# Patient Record
Sex: Male | Born: 1935 | Race: White | Hispanic: No | Marital: Married | Smoking: Former smoker
Health system: Southern US, Community
[De-identification: ages and names within clinical notes are randomized; demographics above are authoritative.]

## PROBLEM LIST (undated history)

## (undated) DIAGNOSIS — F1011 Alcohol abuse, in remission: Secondary | ICD-10-CM

## (undated) DIAGNOSIS — Z860101 Personal history of adenomatous and serrated colon polyps: Secondary | ICD-10-CM

## (undated) DIAGNOSIS — N320 Bladder-neck obstruction: Secondary | ICD-10-CM

## (undated) DIAGNOSIS — Z86010 Personal history of colonic polyps: Secondary | ICD-10-CM

## (undated) DIAGNOSIS — J302 Other seasonal allergic rhinitis: Secondary | ICD-10-CM

## (undated) DIAGNOSIS — K852 Alcohol induced acute pancreatitis without necrosis or infection: Secondary | ICD-10-CM

## (undated) DIAGNOSIS — N400 Enlarged prostate without lower urinary tract symptoms: Secondary | ICD-10-CM

## (undated) DIAGNOSIS — I10 Essential (primary) hypertension: Secondary | ICD-10-CM

## (undated) DIAGNOSIS — E785 Hyperlipidemia, unspecified: Secondary | ICD-10-CM

## (undated) DIAGNOSIS — M1990 Unspecified osteoarthritis, unspecified site: Secondary | ICD-10-CM

## (undated) DIAGNOSIS — J42 Unspecified chronic bronchitis: Secondary | ICD-10-CM

## (undated) DIAGNOSIS — Z8719 Personal history of other diseases of the digestive system: Secondary | ICD-10-CM

## (undated) DIAGNOSIS — Z8709 Personal history of other diseases of the respiratory system: Secondary | ICD-10-CM

## (undated) HISTORY — DX: Alcohol induced acute pancreatitis without necrosis or infection: K85.20

## (undated) HISTORY — DX: Unspecified chronic bronchitis: J42

## (undated) HISTORY — DX: Essential (primary) hypertension: I10

## (undated) HISTORY — DX: Personal history of adenomatous and serrated colon polyps: Z86.0101

## (undated) HISTORY — DX: Hyperlipidemia, unspecified: E78.5

## (undated) HISTORY — DX: Personal history of colon polyps: Z86.010

---

## 2004-03-18 ENCOUNTER — Ambulatory Visit (HOSPITAL_COMMUNITY): Admission: RE | Admit: 2004-03-18 | Discharge: 2004-03-18 | Payer: Self-pay | Admitting: Family Medicine

## 2006-01-29 ENCOUNTER — Ambulatory Visit (HOSPITAL_COMMUNITY): Admission: RE | Admit: 2006-01-29 | Discharge: 2006-01-29 | Payer: Self-pay | Admitting: Family Medicine

## 2006-05-11 ENCOUNTER — Ambulatory Visit (HOSPITAL_COMMUNITY): Admission: RE | Admit: 2006-05-11 | Discharge: 2006-05-11 | Payer: Self-pay | Admitting: Family Medicine

## 2007-07-01 ENCOUNTER — Ambulatory Visit (HOSPITAL_COMMUNITY): Admission: RE | Admit: 2007-07-01 | Discharge: 2007-07-01 | Payer: Self-pay | Admitting: Family Medicine

## 2007-07-23 ENCOUNTER — Ambulatory Visit: Payer: Self-pay | Admitting: Internal Medicine

## 2007-07-26 HISTORY — PX: COLONOSCOPY: SHX174

## 2007-08-13 ENCOUNTER — Encounter: Payer: Self-pay | Admitting: Internal Medicine

## 2007-08-13 ENCOUNTER — Ambulatory Visit: Payer: Self-pay | Admitting: Internal Medicine

## 2007-08-13 ENCOUNTER — Ambulatory Visit (HOSPITAL_COMMUNITY): Admission: RE | Admit: 2007-08-13 | Discharge: 2007-08-13 | Payer: Self-pay | Admitting: Internal Medicine

## 2007-09-17 ENCOUNTER — Ambulatory Visit: Payer: Self-pay | Admitting: Internal Medicine

## 2008-02-06 ENCOUNTER — Emergency Department (HOSPITAL_COMMUNITY): Admission: EM | Admit: 2008-02-06 | Discharge: 2008-02-06 | Payer: Self-pay | Admitting: Emergency Medicine

## 2010-03-16 ENCOUNTER — Encounter: Payer: Self-pay | Admitting: Family Medicine

## 2010-07-09 NOTE — Consult Note (Signed)
NAME:  Noah Jennings, Noah Jennings              ACCOUNT NO.:  [id number]   MEDICAL RECORD NO.:  [id number]          PATIENT TYPE:  AMB   LOCATION:  DAY                           FACILITY:  [facility]   PHYSICIAN:  Noah Jennings, M.D. DATE OF BIRTH:  1936/02/22   DATE OF CONSULTATION:  07/23/2007  DATE OF DISCHARGE:                                 CONSULTATION   CHIEF COMPLAINT:  Fecal incontinence.   REQUESTING PHYSICIAN:  Noah Novas, MD.   HISTORY OF PRESENT ILLNESS:  Patient is a 75 year old Caucasian  gentleman who presents with a three-month history of intermittent fecal  discharge.  He states this has been occurring more constantly over the  past one month.  He will have a sensation of feeling moisture wet and  then would note that his undergarments would be damp.  He describes  having a discharge which he states must be clear.  There is no staining  on his undergarments.  He is able to control his stool, however.  He has  one to two formed bowel movements daily.  He has occasional fresh blood  noted on the toilet tissue.  He has constant low back pain which he has  had for years.  He has had intermittent pelvic pain and a fleeting pain  in his penis without any discharge.  He states his weight has been  stable.  He denies dysuria or hematuria.  He denies any nausea or  vomiting.  He has intermittent heartburn which is self-limiting.  He  denies any dysphagia or odynophagia.  His last colonoscopy was in the  80s.  This was done for blood in the stool.  He does not recall having  any polyps.  He has had a barium enema recently and back in 2006.  Recent study showed scattered descending and sigmoid colon diverticula.  The cecum was in the right mid abdomen.   CURRENT MEDICATIONS:  1. Lovastatin 20 mg daily.  2. Diovan daily.  3. Aspirin 81 mg daily.  4. Xopenex inhaler daily.   ALLERGIES:  No known drug allergies.   PAST MEDICAL HISTORY:  1. Hypertension.  2.  Hypercholesterolemia.  3. Chronic low back pain.  4. Chronic bronchitis.  5. Hiatal hernia.  6. Remote alcoholic pancreatitis per history.  7. Remote colonoscopy as above.  8. Bilateral cataract surgery.   FAMILY HISTORY:  Mother is 54 with history of intestinal blockage.  Father deceased at age 41 with emphysema, CAD, diabetes.  No family  history of colorectal cancer or chronic GI illnesses.   SOCIAL HISTORY:  He is married.  He has two children.  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  He quit smoking in 1998.  He smoked for  over 45 years, one pack a day.  He quit alcohol consumption in 1998.  States he previously had been a daily user and on the weekends and  raised himself as a moderate user.   REVIEW OF SYSTEMS:  See HPI for GI.  CONSTITUTIONAL:  Denies weight  loss.  CARDIOPULMONARY:  No chest pain or shortness of  breath.  See HPI  for GU.   PHYSICAL EXAMINATION:  VITAL SIGNS:  Weight 178, height 5 feet 6 inches,  temperature 97.8, blood pressure 160/90, pulse 64.  GENERAL APPEARANCE:  A pleasant, well-nourished, well-developed  Caucasian male in no acute distress.  SKIN:  Warm and dry, no jaundice.  HEENT:  Sclerae are nonicteric.  Oropharyngeal mucosa moist and pink.  No lesions, erythema or exudate.  NECK:  No lymphadenopathy or thyromegaly.  CHEST:  Lungs clear to auscultation.  CARDIOVASCULAR:  Regular rate and rhythm, normal S1 and S2.  No murmurs,  rubs, or gallops.  ABDOMEN:  Positive bowel sounds.  Abdomen soft, nontender, nondistended,  no organomegaly or masses.  No rebound, no guarding, no abdominal  bruits.  He has a small umbilical hernia easily reducible, nontender.  RECTAL:  No external lesions.  Good sphincter tone.  No masses in the  rectal vault.  No stool present.  LOWER EXTREMITIES:  No edema.   IMPRESSION:  [name] is a 75 year old gentleman with a three-month  history of anorectal clear discharge.  His description does not  appear  to be consistent with fecal incontinence.  He is able to control his  stools.  He does have intermittent bright red blood per rectum.  He has  had a barium enema which is not a good modality for looking at the  rectal area.  We will go ahead and pursue colonoscopy at this time as  recommended by Dr. Jena Gauss.   PLAN:  1. Colonoscopy in the near future.  2. Further recommendations to follow.   I discussed risks, benefits, and alternatives with regards to, but not  limited to, the risk of reaction to medication, bleeding, infection,  perforation with the patient and he is agreeable to proceed.   I would like to thank Noah Novas, MD, for allowing [place] to  take part in the care of this patient.      Noah Jennings, P.AJonathon Jennings, M.D.  Electronically Signed    LL/MEDQ  D:  07/23/2007  T:  07/23/2007  Job:  [id number]

## 2010-07-09 NOTE — Op Note (Signed)
NAME:  Noah Jennings, Noah Jennings              ACCOUNT NO.:  [id number]   MEDICAL RECORD NO.:  [id number]          PATIENT TYPE:  AMB   LOCATION:  DAY                           FACILITY:  [facility]   PHYSICIAN:  R. Roetta Sessions, M.D. DATE OF BIRTH:  1935/06/01   DATE OF PROCEDURE:  08/13/2007  DATE OF DISCHARGE:                               OPERATIVE REPORT   INDICATIONS FOR PROCEDURE:  A 71-year gentleman with 31-month history of  clear anorectal discharge, occasional small red blood per rectum.  Barium enema recently demonstrated some subtle left-sided diverticula,  no obvious abnormalities otherwise, because of his hematochezia and the  limited sensitivity of barium contrast studies he is undergoing  colonoscopy.  This approach has been discussed with [name] at  length.  Risks, benefits, alternatives, and limitations have been  reviewed.  Questions answered and he is agreeable.  Please see the  documentation in the medical record.   PROCEDURE NOTE:  O2 saturation, blood pressure, pulse, respirations  monitored throughout the entire procedure.  Conscious sedation, Versed 4  mg IV, Demerol 100 mg IV in divided doses.   INSTRUMENT:  Pentax video chip system.   FINDINGS:  Digital rectal exam revealed no abnormalities.  Endoscopic  findings:  Prep was adequate.  Colon:  Colonic mucosa was surveyed from  the rectosigmoid junction to the left transverse, right colon,  appendiceal orifice, ileocecal valve, and cecum.  These structures were  well-seen and photographed for the record.  Terminal ileum was intubated  in centimeters from this level.  Scope was slowly cautiously withdrawn.  All previously mentioned mucosal surfaces were again seen.  The patient  had a flat 5-mm adenomatous-appearing polyp in the mid ascending colon,  it was cold snared and recovered through the scope.  The remainder of  colonic mucosa appeared normal.  I did not identify any diverticula,  although shallow diverticula  may not have been appreciated on the  examination.  Scope was pulled down into the rectum with examination of  the rectal mucosa including retroflex view of the anal verge, on fos  view of the anal canal demonstrated a diminutive rectal polyp 15 cm  which was cold biopsied, anal papilla and friable anal canal hemorrhoids  only.  The patient tolerated the procedure well, was reactive in  endoscopy.   IMPRESSION:  1. Friable anal canal hemorrhoids.  2. Anal papilla.  3. Diminutive rectal polyp 15 cm status post cold biopsy removal.      Remainder of the rectal mucosa appeared normal.  4. Flat adenomatous-appearing polyp in mid ascending colon status post      cold snare polypectomy.  Remainder of colonic mucosa and terminal      ileal mucosa appeared normal.  I suspect the patient has bled from      this anorectum.   RECOMMENDATIONS:  1. Ten day course of Anusol-HC Suppositories 1 per rectum at bedtime      daily, Metamucil or Citrucel fiber      supplement.  Hopefully, this will take care of his seepage      problems.  2. Follow  up on path.  3. I plan to see [name] back in followup in 1 month.      Jonathon Bellows, M.D.  Electronically Signed     RMR/MEDQ  D:  08/13/2007  T:  08/13/2007  Job:  [id number]   cc:   Melvyn Novas, MD  Fax: [phone]

## 2010-07-09 NOTE — Assessment & Plan Note (Signed)
NAME:  Noah Jennings               CHART#:  [id number]   DATE:  09/17/2007                       DOB:  08/12/1935   CHIEF COMPLAINT:  Followup colonoscopy and rectal discharge.   PROBLEM LIST:  1. Rectal discharge.  2. Anal canal hemorrhoids.  3. History of adenomatous colon polyp found on last colonoscopy, Dr.      Jena Jennings on 08/13/2007.  He had a serrated adenoma removed from his      ascending colon.  He also had a hyperplastic polyp of 15 cm.  4. Hypertension.  5. Hypercholesterolemia.  6. Chronic low back pain.  7. Chronic bronchitis.  8. Remote history of alcoholic pancreatitis.  9. Bilateral cataracts.  10.Hiatal hernia.   SUBJECTIVE:  The patient is a 75 year old Caucasian male.  He recently  underwent colonoscopy as described above.  He was found to have  hemorrhoids.  He was started on Metamucil, he seems to be doing well  with this.  He continues to complain of clear rectal discharge, which  wets his undergarments for the last 3 months.  There is no purulent  discharge.  No fever.  No proctalgia.  Denies any diarrhea or  constipation.  He is generally having 2-3 semiformed bowel movements per  day.  He completed the course of Anusol suppositories.  Weight is  stable.  His appetite is good.  Denies any fever or chills.   CURRENT MEDICATIONS:  See the list from 09/17/2007.   ALLERGIES:  No known drug allergies.   OBJECTIVE:  VITAL SIGNS:  Weight 178 pounds, height 66 inches, temp  98.5, blood pressure 130/78, and pulse 72.  GENERAL:  He is a well-developed, well-nourished elderly Caucasian male  in no acute distress.  HEENT:  Sclerae clear and nonicteric.  Conjunctivae pink.  Oropharynx  pink and moist without lesions.  CHEST:  Heart has regular rhythm.  Normal S1 and S2.  ABDOMEN:  Positive bowel sounds x4.  No bruits auscultated.  Soft,  nontender, and nondistended without palpable mass or hepatosplenomegaly.  No rebound, tenderness or guarding.  EXTREMITIES:   Without clubbing or edema.   ASSESSMENT:  The patient is a 75 year old Caucasian male with history of  adenomatous polyp, hemorrhoids, and clear rectal discharge.  I explained  to him that most discharge is benign, and we will continue to monitor  this and see how he does.   PLAN:  1. Colonoscopy in June of 2014.  2. Begin Alinia or similar probiotics on daily basis.  3. He is to call if he has any further problems with rectal discharge.       Noah Jennings, N.P.  Electronically Signed     R. Roetta Sessions, M.D.  Electronically Signed    KJ/MEDQ  D:  09/17/2007  T:  09/17/2007  Job:  [id number]   cc:   Noah Novas, MD

## 2010-11-29 LAB — DIFFERENTIAL
Basophils Relative: 0 % (ref 0–1)
Monocytes Absolute: 1.1 10*3/uL — ABNORMAL HIGH (ref 0.1–1.0)
Monocytes Relative: 7 % (ref 3–12)
Neutro Abs: 14 10*3/uL — ABNORMAL HIGH (ref 1.7–7.7)

## 2010-11-29 LAB — BASIC METABOLIC PANEL
CO2: 26 mEq/L (ref 19–32)
Calcium: 8.3 mg/dL — ABNORMAL LOW (ref 8.4–10.5)
Chloride: 103 mEq/L (ref 96–112)
GFR calc Af Amer: >60 mL/min (ref >60)
Potassium: 3.3 mEq/L — ABNORMAL LOW (ref 3.5–5.1)
Sodium: 134 mEq/L — ABNORMAL LOW (ref 135–145)

## 2010-11-29 LAB — CBC
Hemoglobin: 13.1 g/dL (ref 13.0–17.0)
MCHC: 33.7 g/dL (ref 30.0–36.0)
RBC: 4.38 MIL/uL (ref 4.22–5.81)

## 2010-11-29 LAB — CK TOTAL AND CKMB (NOT AT ARMC)
CK, MB: 1.4 ng/mL (ref 0.3–4.0)
Total CK: 50 U/L (ref 7–232)

## 2010-11-29 LAB — TROPONIN I: Troponin I: 0.02 ng/mL (ref 0.00–0.06)

## 2012-08-10 ENCOUNTER — Encounter: Payer: Self-pay | Admitting: Internal Medicine

## 2012-12-20 ENCOUNTER — Telehealth: Payer: Self-pay | Admitting: *Deleted

## 2012-12-20 NOTE — Telephone Encounter (Signed)
Opened in Error.

## 2012-12-22 ENCOUNTER — Encounter: Payer: Self-pay | Admitting: Gastroenterology

## 2012-12-22 ENCOUNTER — Encounter (INDEPENDENT_AMBULATORY_CARE_PROVIDER_SITE_OTHER): Payer: Self-pay

## 2012-12-22 ENCOUNTER — Ambulatory Visit (INDEPENDENT_AMBULATORY_CARE_PROVIDER_SITE_OTHER): Payer: Medicare Other | Admitting: Gastroenterology

## 2012-12-22 VITALS — BP 119/63 | HR 73 | Temp 97.6°F | Ht 66.0 in | Wt 171.8 lb

## 2012-12-22 DIAGNOSIS — Z86010 Personal history of colonic polyps: Secondary | ICD-10-CM | POA: Insufficient documentation

## 2012-12-22 MED ORDER — PEG 3350-KCL-NA BICARB-NACL 420 G PO SOLR: 4000.0000 mL | ORAL | Status: DC

## 2012-12-22 NOTE — Assessment & Plan Note (Signed)
Surveillance colonoscopy given history of adenomatous colon polyp.  I have discussed the risks, alternatives, benefits with regards to but not limited to the risk of reaction to medication, bleeding, infection, perforation and the patient is agreeable to proceed. Written consent to be obtained.

## 2012-12-22 NOTE — Patient Instructions (Signed)
1. Colonoscopy with Dr. Rourk as scheduled. Please see separate instructions.  

## 2012-12-22 NOTE — Progress Notes (Signed)
Primary Care Physician:  Isabella Stalling, MD  Primary Gastroenterologist:  Roetta Sessions, MD   Chief Complaint  Patient presents with  . Follow-up    HPI:  Noah Jennings is a 77 y.o. male here to schedule five-year surveillance colonoscopy. He has a history of adenomatous colon polyp on his last colonoscopy. Doing very well. Take Metamucil every day. Bowel movement regular. No melena rectal bleeding. Occasional heartburn related to certain foods. No dysphagia, vomiting, unintentional weight loss, abdominal pain.  Current Outpatient Prescriptions  Medication Sig Dispense Refill  . albuterol (PROVENTIL HFA;VENTOLIN HFA) 108 (90 BASE) MCG/ACT inhaler Inhale 2 puffs into the lungs every 4 (four) hours as needed for wheezing.      . rosuvastatin (CRESTOR) 10 MG tablet Take 10 mg by mouth daily.      . tamsulosin (FLOMAX) 0.4 MG CAPS capsule Take 0.4 mg by mouth daily.      . valsartan-hydrochlorothiazide (DIOVAN-HCT) 320-25 MG per tablet Take 1 tablet by mouth daily.       No current facility-administered medications for this visit.    Allergies as of 12/22/2012  . (No Known Allergies)    Past Medical History  Diagnosis Date  . Hx of adenomatous colonic polyps     07/2007  . HTN (hypertension)   . Hyperlipidemia   . Chronic bronchitis   . Alcoholic pancreatitis     remote    Past Surgical History  Procedure Laterality Date  . Colonoscopy  07/2007    ZOX:[id number] anal canal hemorrhoids/Anal papilla/Diminutive rectal polyp 15 cm status post cold biopsy removal/Flat adenomatous-appearing polyp in mid ascending colon status post  cold snare polypectomy.  Remainder of colonic mucosa and terminal ileal mucosa appeared normal. Serrated adenoma.    Family History  Problem Relation Age of Onset  . Colon cancer Neg Hx     History   Social History  . Marital Status: Married    Spouse Name: N/A    Number of Children: 2  . Years of Education: N/A   Occupational History  .  Not on file.   Social History Main Topics  . Smoking status: Former Smoker    Start date: 03/24/1996  . Smokeless tobacco: Not on file     Comment: smoked 45 years  . Alcohol Use: No     Comment: quit 1998  . Drug Use: No  . Sexual Activity: Not on file   Other Topics Concern  . Not on file   Social History Narrative  . No narrative on file      ROS:  General: Negative for anorexia, weight loss, fever, chills, fatigue, weakness. Eyes: Negative for vision changes.  ENT: Negative for hoarseness, difficulty swallowing , nasal congestion. CV: Negative for chest pain, angina, palpitations, dyspnea on exertion, peripheral edema.  Respiratory: Negative for dyspnea at rest, dyspnea on exertion, cough, sputum, wheezing.  GI: See history of present illness. GU:  Negative for dysuria, hematuria, urinary incontinence, urinary frequency, nocturnal urination.  MS: Negative for joint pain, low back pain.  Derm: Negative for rash or itching.  Neuro: Negative for weakness, abnormal sensation, seizure, frequent headaches, memory loss, confusion.  Psych: Negative for anxiety, depression, suicidal ideation, hallucinations.  Endo: Negative for unusual weight change.  Heme: Negative for bruising or bleeding. Allergy: Negative for rash or hives.    Physical Examination:  BP 119/63  Pulse 73  Temp(Src) 97.6 F (36.4 C) (Oral)  Ht 5\' 6"  (1.676 m)  Wt 171 lb 12.8 oz (77.928  kg)  BMI 27.74 kg/m2   General: Well-nourished, well-developed in no acute distress.  Head: Normocephalic, atraumatic.   Eyes: Conjunctiva pink, no icterus. Mouth: Oropharyngeal mucosa moist and pink , no lesions erythema or exudate. Neck: Supple without thyromegaly, masses, or lymphadenopathy.  Lungs: Clear to auscultation bilaterally.  Heart: Regular rate and rhythm, no murmurs rubs or gallops.  Abdomen: Bowel sounds are normal, nontender, nondistended, no hepatosplenomegaly or masses, no abdominal bruits or     hernia , no rebound or guarding.   Rectal: Deferred Extremities: No lower extremity edema. No clubbing or deformities.  Neuro: Alert and oriented x 4 , grossly normal neurologically.  Skin: Warm and dry, no rash or jaundice.   Psych: Alert and cooperative, normal mood and affect.

## 2012-12-22 NOTE — Progress Notes (Signed)
cc'd to pcp 

## 2013-01-11 ENCOUNTER — Encounter (HOSPITAL_COMMUNITY): Payer: Self-pay | Admitting: *Deleted

## 2013-01-11 ENCOUNTER — Ambulatory Visit (HOSPITAL_COMMUNITY)
Admission: RE | Admit: 2013-01-11 | Discharge: 2013-01-11 | Disposition: A | Discharge status: Home Health | Payer: Medicare Other | Source: Ambulatory Visit | Attending: Internal Medicine | Admitting: Internal Medicine

## 2013-01-11 ENCOUNTER — Encounter (HOSPITAL_COMMUNITY)
Admission: RE | Disposition: A | Discharge status: Home Health | Payer: Self-pay | Source: Ambulatory Visit | Attending: Internal Medicine

## 2013-01-11 DIAGNOSIS — Z860100 Personal history of colon polyps, unspecified: Secondary | ICD-10-CM | POA: Insufficient documentation

## 2013-01-11 DIAGNOSIS — Z1211 Encounter for screening for malignant neoplasm of colon: Secondary | ICD-10-CM

## 2013-01-11 DIAGNOSIS — D126 Benign neoplasm of colon, unspecified: Secondary | ICD-10-CM | POA: Insufficient documentation

## 2013-01-11 DIAGNOSIS — I10 Essential (primary) hypertension: Secondary | ICD-10-CM | POA: Insufficient documentation

## 2013-01-11 HISTORY — PX: COLONOSCOPY: SHX5424

## 2013-01-11 SURGERY — COLONOSCOPY
Anesthesia: Moderate Sedation

## 2013-01-11 MED ORDER — MIDAZOLAM HCL 5 MG/5ML IJ SOLN
INTRAMUSCULAR | Status: AC
  Filled 2013-01-11: qty 10

## 2013-01-11 MED ORDER — ONDANSETRON HCL 4 MG/2ML IJ SOLN
INTRAMUSCULAR | Status: AC
  Filled 2013-01-11: qty 2

## 2013-01-11 MED ORDER — MEPERIDINE HCL 100 MG/ML IJ SOLN
INTRAMUSCULAR | Status: DC | PRN
  Administered 2013-01-11: 50 mg via INTRAVENOUS
  Administered 2013-01-11: 25 mg via INTRAVENOUS

## 2013-01-11 MED ORDER — ONDANSETRON HCL 4 MG/2ML IJ SOLN
INTRAMUSCULAR | Status: DC | PRN
  Administered 2013-01-11: 4 mg via INTRAVENOUS

## 2013-01-11 MED ORDER — MEPERIDINE HCL 100 MG/ML IJ SOLN
INTRAMUSCULAR | Status: AC
  Filled 2013-01-11: qty 2

## 2013-01-11 MED ORDER — MIDAZOLAM HCL 5 MG/5ML IJ SOLN
INTRAMUSCULAR | Status: DC | PRN
  Administered 2013-01-11: 2 mg via INTRAVENOUS
  Administered 2013-01-11 (×2): 1 mg via INTRAVENOUS

## 2013-01-11 MED ORDER — STERILE WATER FOR IRRIGATION IR SOLN
Status: DC | PRN
  Administered 2013-01-11: 11:00:00

## 2013-01-11 MED ORDER — SODIUM CHLORIDE 0.9 % IV SOLN
INTRAVENOUS | Status: DC
  Administered 2013-01-11: 1000 mL via INTRAVENOUS

## 2013-01-11 NOTE — Interval H&P Note (Signed)
History and Physical Interval Note:  01/11/2013 11:11 AM  Noah Jennings  has presented today for surgery, with the diagnosis of COLON POLYPS  The various methods of treatment have been discussed with the patient and family. After consideration of risks, benefits and other options for treatment, the patient has consented to  Procedure(s) with comments: COLONOSCOPY (N/A) - 11:15 as a surgical intervention .  The patient's history has been reviewed, patient examined, no change in status, stable for surgery.  I have reviewed the patient's chart and labs.  Questions were answered to the patient's satisfaction.   No change. Surveillance colonoscopy per plan.The risks, benefits, limitations, alternatives and imponderables have been reviewed with the patient. Questions have been answered. All parties are agreeable.   Eula Listen

## 2013-01-11 NOTE — Op Note (Signed)
[facility] [address] [place], [address]   COLONOSCOPY PROCEDURE REPORT  PATIENT: Noah Jennings, Noah Jennings  MR#:         [id number] BIRTHDATE: [birth year] , 76  yrs. old GENDER: Male ENDOSCOPIST: R.  Roetta Sessions, MD FACP FACG REFERRED BY:  Oval Linsey, M.D. PROCEDURE DATE:  01/11/2013 PROCEDURE:     Colonoscopy with snare polypectomy  INDICATIONS: History of colonic adenoma  INFORMED CONSENT:  The risks, benefits, alternatives and imponderables including but not limited to bleeding, perforation as well as the possibility of a missed lesion have been reviewed.  The potential for biopsy, lesion removal, etc. have also been discussed.  Questions have been answered.  All parties agreeable. Please see the history and physical in the medical record for more information.  MEDICATIONS: Versed 4 mg IV and Demerol 75 mg IV in divided doses. Zofran 4 mg IV.  DESCRIPTION OF PROCEDURE:  After a digital rectal exam was performed, the EC-[id number] ([id number])  colonoscope was advanced from the anus through the rectum and colon to the area of the cecum, ileocecal valve and appendiceal orifice.  The cecum was deeply intubated.  These structures were well-seen and photographed for the record.  From the level of the cecum and ileocecal valve, the scope was slowly and cautiously withdrawn.  The mucosal surfaces were carefully surveyed utilizing scope tip deflection to facilitate fold flattening as needed.  The scope was pulled down into the rectum where a thorough examination including retroflexion was performed.    FINDINGS:  Adequate preparation. Anal patella; otherwise, normal appearing rectum. (1) 4 mm polyp in the base of the cecum; otherwise, the remainder of the colonic mucosa appeared normal.  THERAPEUTIC / DIAGNOSTIC MANEUVERS PERFORMED:  The above-mentioned polyp was cold snared/removed and recovered for the pathologist.  COMPLICATIONS: None  CECAL WITHDRAWAL  TIME:  8 minutes  IMPRESSION:  Colonic polyp-removed as described above  RECOMMENDATIONS: Further recommendations to follow pending review of pathology report   _______________________________ eSigned:  R. Roetta Sessions, MD FACP [facility] 01/11/2013 11:39 AM   CC:

## 2013-01-11 NOTE — H&P (View-Only) (Signed)
Primary Care Physician:  DONDIEGO,RICHARD M, MD  Primary Gastroenterologist:  Michael Rourk, MD   Chief Complaint  Patient presents with  . Follow-up    HPI:  Noah Jennings is a 77 y.o. male here to schedule five-year surveillance colonoscopy. He has a history of adenomatous colon polyp on his last colonoscopy. Doing very well. Take Metamucil every day. Bowel movement regular. No melena rectal bleeding. Occasional heartburn related to certain foods. No dysphagia, vomiting, unintentional weight loss, abdominal pain.  Current Outpatient Prescriptions  Medication Sig Dispense Refill  . albuterol (PROVENTIL HFA;VENTOLIN HFA) 108 (90 BASE) MCG/ACT inhaler Inhale 2 puffs into the lungs every 4 (four) hours as needed for wheezing.      . rosuvastatin (CRESTOR) 10 MG tablet Take 10 mg by mouth daily.      . tamsulosin (FLOMAX) 0.4 MG CAPS capsule Take 0.4 mg by mouth daily.      . valsartan-hydrochlorothiazide (DIOVAN-HCT) 320-25 MG per tablet Take 1 tablet by mouth daily.       No current facility-administered medications for this visit.    Allergies as of 12/22/2012  . (No Known Allergies)    Past Medical History  Diagnosis Date  . Hx of adenomatous colonic polyps     07/2007  . HTN (hypertension)   . Hyperlipidemia   . Chronic bronchitis   . Alcoholic pancreatitis     remote    Past Surgical History  Procedure Laterality Date  . Colonoscopy  07/2007    RMR:Friable anal canal hemorrhoids/Anal papilla/Diminutive rectal polyp 15 cm status post cold biopsy removal/Flat adenomatous-appearing polyp in mid ascending colon status post  cold snare polypectomy.  Remainder of colonic mucosa and terminal ileal mucosa appeared normal. Serrated adenoma.    Family History  Problem Relation Age of Onset  . Colon cancer Neg Hx     History   Social History  . Marital Status: Married    Spouse Name: N/A    Number of Children: 2  . Years of Education: N/A   Occupational History  .  Not on file.   Social History Main Topics  . Smoking status: Former Smoker    Start date: 03/24/1996  . Smokeless tobacco: Not on file     Comment: smoked 45 years  . Alcohol Use: No     Comment: quit 1998  . Drug Use: No  . Sexual Activity: Not on file   Other Topics Concern  . Not on file   Social History Narrative  . No narrative on file      ROS:  General: Negative for anorexia, weight loss, fever, chills, fatigue, weakness. Eyes: Negative for vision changes.  ENT: Negative for hoarseness, difficulty swallowing , nasal congestion. CV: Negative for chest pain, angina, palpitations, dyspnea on exertion, peripheral edema.  Respiratory: Negative for dyspnea at rest, dyspnea on exertion, cough, sputum, wheezing.  GI: See history of present illness. GU:  Negative for dysuria, hematuria, urinary incontinence, urinary frequency, nocturnal urination.  MS: Negative for joint pain, low back pain.  Derm: Negative for rash or itching.  Neuro: Negative for weakness, abnormal sensation, seizure, frequent headaches, memory loss, confusion.  Psych: Negative for anxiety, depression, suicidal ideation, hallucinations.  Endo: Negative for unusual weight change.  Heme: Negative for bruising or bleeding. Allergy: Negative for rash or hives.    Physical Examination:  BP 119/63  Pulse 73  Temp(Src) 97.6 F (36.4 C) (Oral)  Ht 5' 6" (1.676 m)  Wt 171 lb 12.8 oz (77.928   kg)  BMI 27.74 kg/m2   General: Well-nourished, well-developed in no acute distress.  Head: Normocephalic, atraumatic.   Eyes: Conjunctiva pink, no icterus. Mouth: Oropharyngeal mucosa moist and pink , no lesions erythema or exudate. Neck: Supple without thyromegaly, masses, or lymphadenopathy.  Lungs: Clear to auscultation bilaterally.  Heart: Regular rate and rhythm, no murmurs rubs or gallops.  Abdomen: Bowel sounds are normal, nontender, nondistended, no hepatosplenomegaly or masses, no abdominal bruits or     hernia , no rebound or guarding.   Rectal: Deferred Extremities: No lower extremity edema. No clubbing or deformities.  Neuro: Alert and oriented x 4 , grossly normal neurologically.  Skin: Warm and dry, no rash or jaundice.   Psych: Alert and cooperative, normal mood and affect.   

## 2013-01-12 ENCOUNTER — Encounter: Payer: Self-pay | Admitting: Internal Medicine

## 2013-01-18 ENCOUNTER — Encounter (HOSPITAL_COMMUNITY): Payer: Self-pay | Admitting: Internal Medicine

## 2016-02-26 ENCOUNTER — Other Ambulatory Visit (HOSPITAL_COMMUNITY): Payer: Self-pay | Admitting: Family Medicine

## 2016-02-26 DIAGNOSIS — R1310 Dysphagia, unspecified: Secondary | ICD-10-CM

## 2016-02-26 DIAGNOSIS — R52 Pain, unspecified: Secondary | ICD-10-CM

## 2016-02-29 ENCOUNTER — Ambulatory Visit (HOSPITAL_COMMUNITY)
Admission: RE | Admit: 2016-02-29 | Discharge: 2016-02-29 | Disposition: A | Discharge status: Home / Self Care | Payer: Medicare Other | Source: Ambulatory Visit | Attending: Family Medicine | Admitting: Family Medicine

## 2016-02-29 DIAGNOSIS — R1310 Dysphagia, unspecified: Secondary | ICD-10-CM

## 2016-02-29 DIAGNOSIS — R52 Pain, unspecified: Secondary | ICD-10-CM

## 2016-02-29 DIAGNOSIS — K224 Dyskinesia of esophagus: Secondary | ICD-10-CM | POA: Diagnosis not present

## 2017-07-24 IMAGING — RF DG ESOPHAGUS
14 of 15 series · 14 of 24 positions shown · non-contrast
Comparison: None

CLINICAL DATA: Dysphagia, hurts to eat solids and liquids, feels
like a night sticking in his chest, history hypertension, chronic
bronchitis, alcoholic pancreatitis, former smoker

EXAM:
ESOPHOGRAM / BARIUM SWALLOW / BARIUM TABLET STUDY
TECHNIQUE: Combined double contrast and single contrast examination performed
using effervescent crystals, thick barium liquid, and thin barium
liquid. The patient was observed with fluoroscopy swallowing a 13 mm
barium sulphate tablet.
FLUOROSCOPY TIME:  Fluoroscopy Time:  1 minutes 36 seconds
Radiation Exposure Index (if provided by the fluoroscopic device):
19.1 mGy
Number of Acquired Spot Images: Multiple screen captures during
fluoroscopy

[Series 1: cp_standard · 0.19mm/px · 1 of 34 frames shown (1 of 14)]
[frame 5/34]
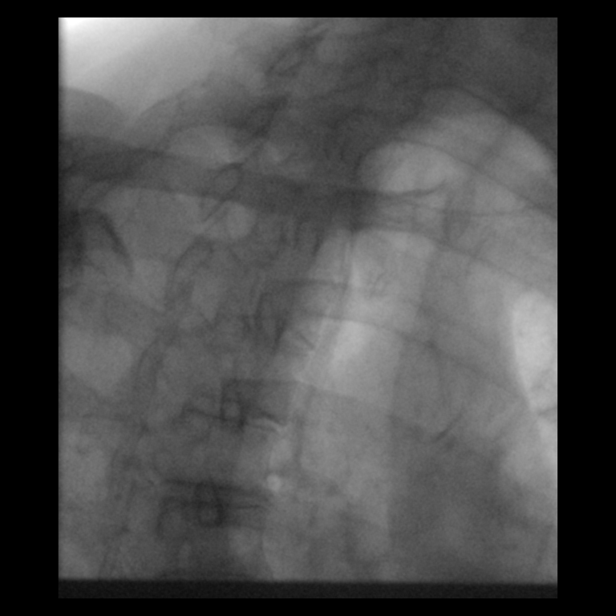

[Series 2: cp_standard · 0.19mm/px · 1 of 40 frames shown (2 of 14)]
[frame 1/40]
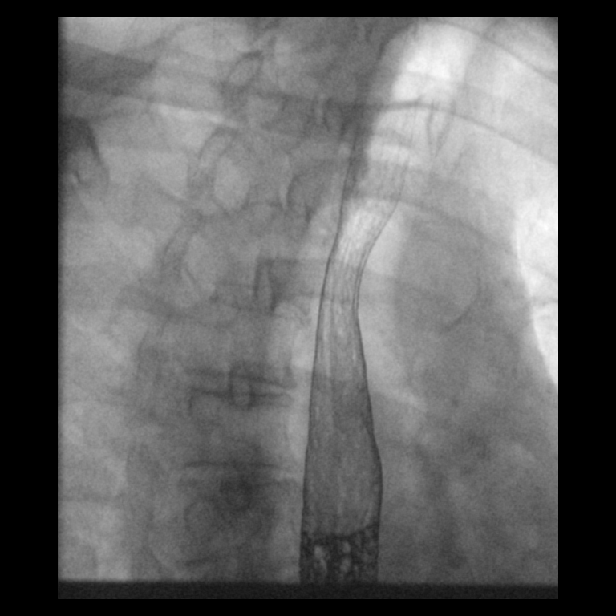

[Series 3: cp_standard · 0.19mm/px · 1 of 27 frames shown (3 of 14)]
[frame 14/27]
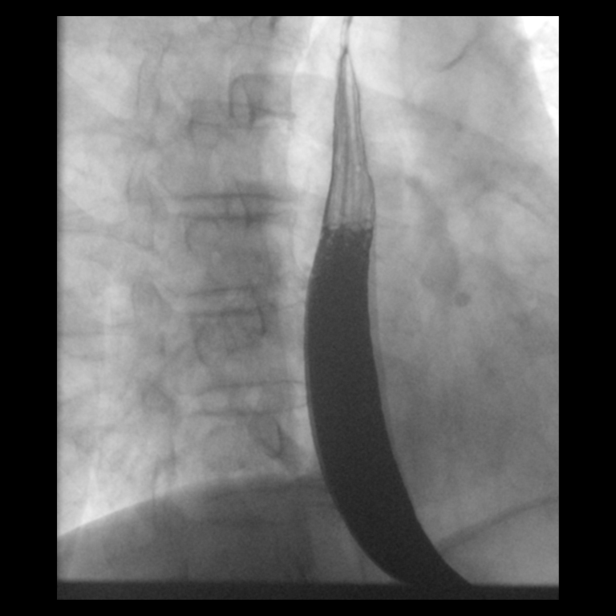

[Series 4: cp_standard · 0.20mm/px · 1 of 33 frames shown (4 of 14)]
[frame 5/33]
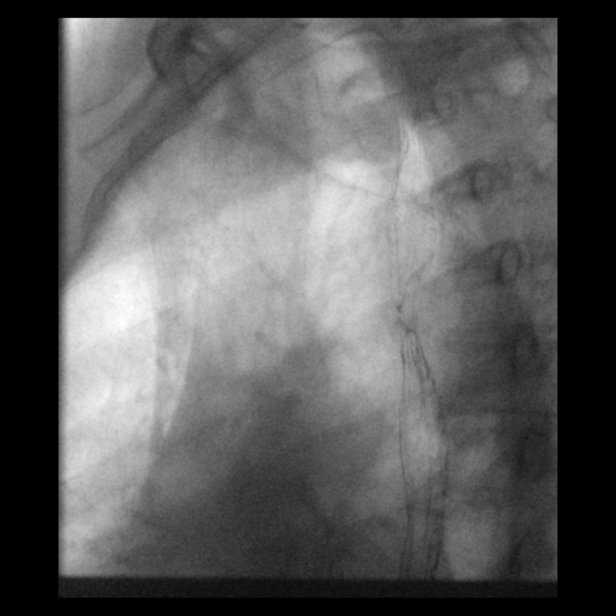

[Series 5: cp_standard · 0.20mm/px · 1 of 37 frames shown (5 of 14)]
[frame 6/37]
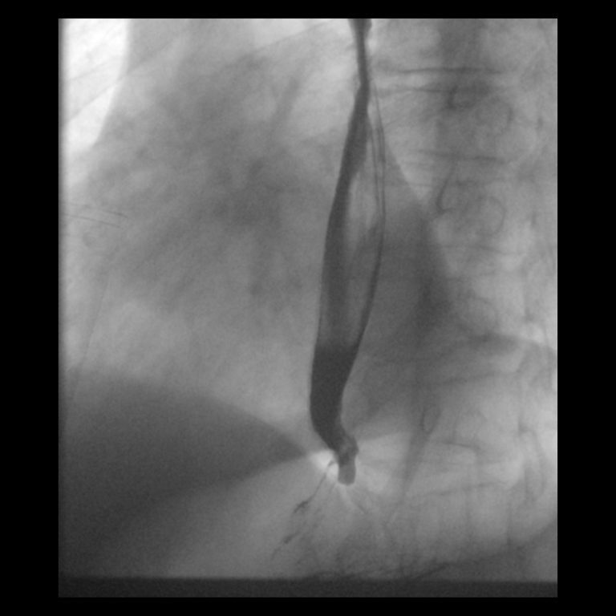

[Series 6: cp_standard · 0.20mm/px · 1 of 31 frames shown (6 of 14)]
[frame 5/31]
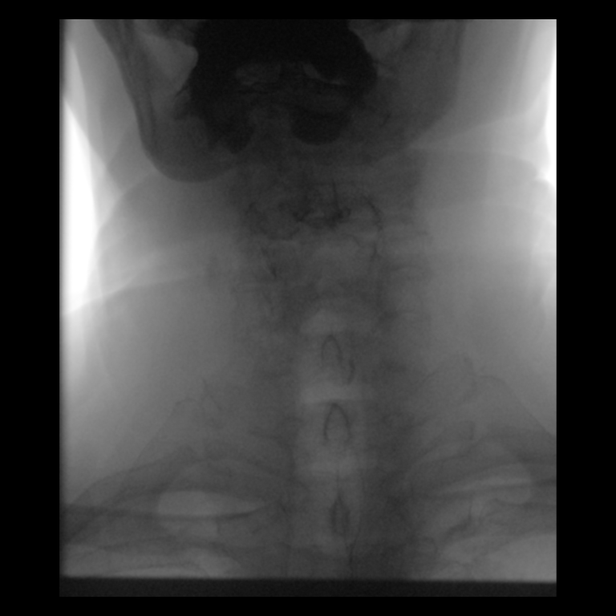

[Series 7: cp_standard · 0.18mm/px · 1 of 1 slices shown (7 of 14)]
[im 1/1]
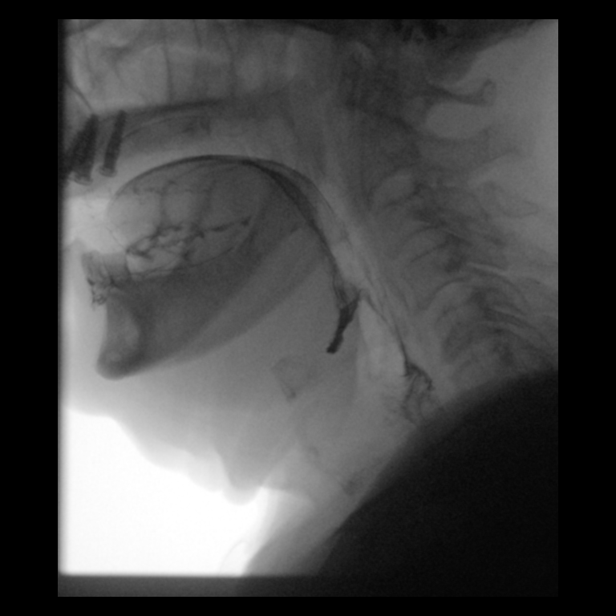

[Series 8: cp_standard · 0.18mm/px · 1 of 23 frames shown (8 of 14)]
[frame 20/23]
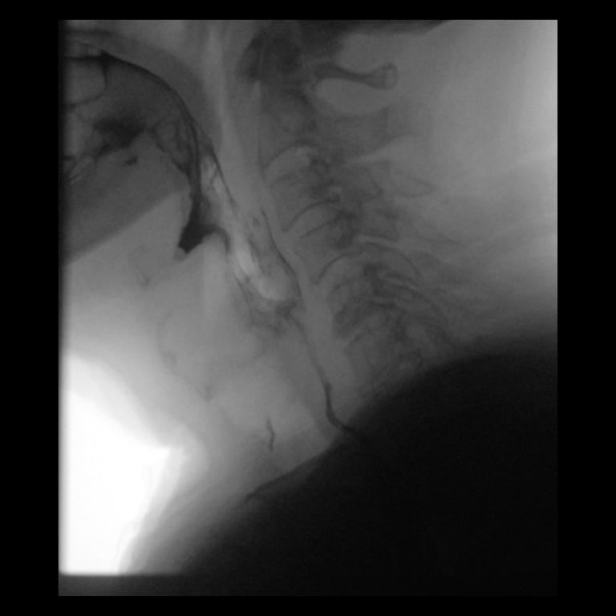

[Series 10: cp_standard · 0.20mm/px · 1 of 4 frames shown (9 of 14)]
[frame 3/4]
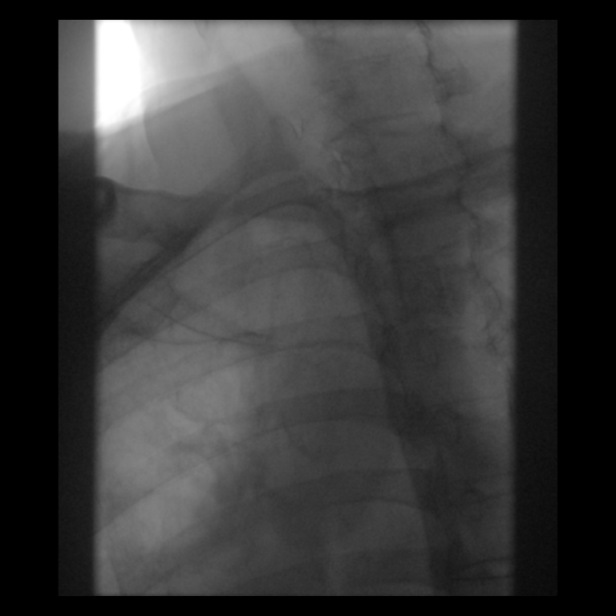

[Series 11: cp_standard · 0.20mm/px · 1 of 19 frames shown (10 of 14)]
[frame 10/19]
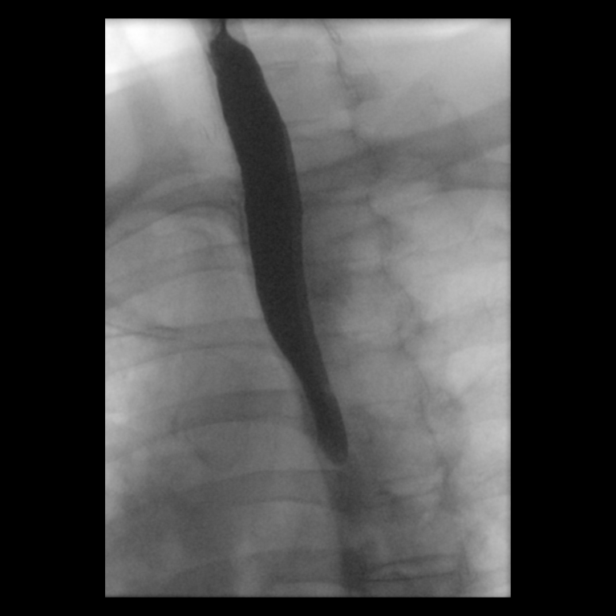

[Series 12: cp_standard · 0.20mm/px · 1 of 40 frames shown (11 of 14)]
[frame 35/40]
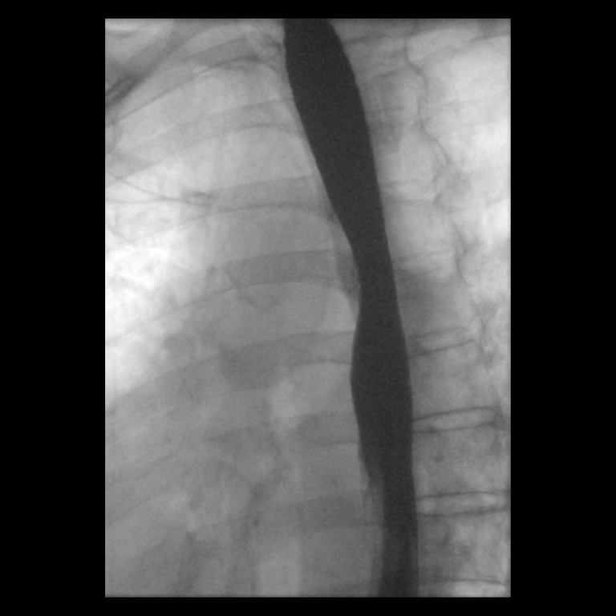

[Series 12: cp_standard · 0.20mm/px · 1 of 14 frames shown (12 of 14)]
[frame 4/14]
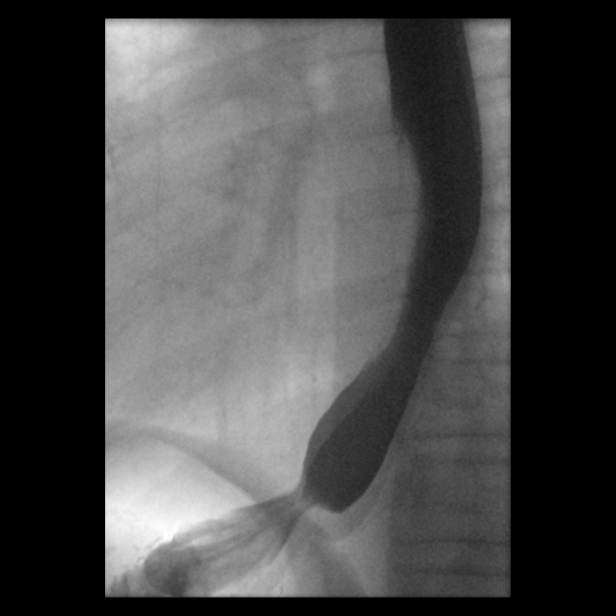

[Series 14: cp_standard · 0.20mm/px · 1 of 7 frames shown (13 of 14)]
[frame 6/7]
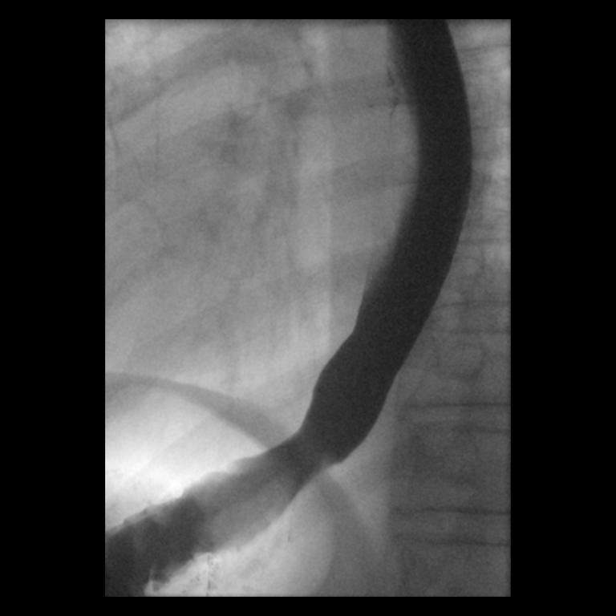

[Series 15: cp_standard · 0.20mm/px · 1 of 23 frames shown (14 of 14)]
[frame 23/23]
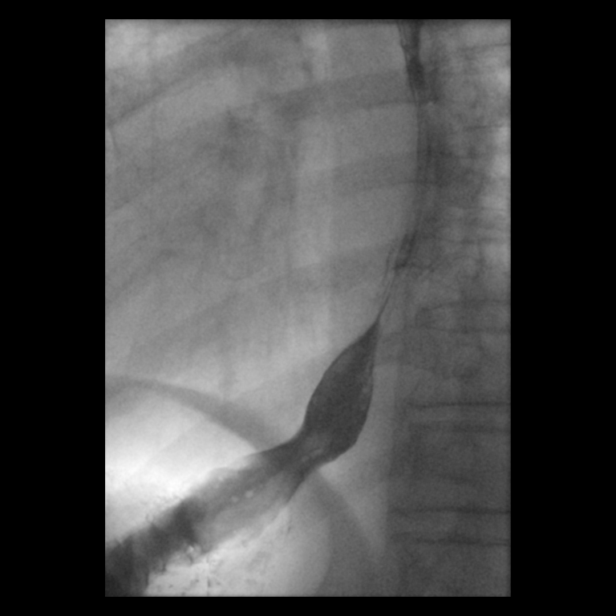

[14 of 24 positions shown; findings below may reference images not displayed]

FINDINGS: Normal esophageal distention.

No esophageal mass or stricture.

12.5 mm diameter barium tablet passed from oral cavity to stomach
without delay.

Smooth appearance of esophageal mucosa on air contrast imaging.

No esophageal ulceration or irregularity identified.

No hiatal hernia or gastroesophageal reflux seen during the exam.

Diffuse impairment of esophageal motility with incomplete clearance
of barium by primary peristaltic waves.

Prolonged retention of contrast within the thoracic esophagus with
patient in an RAO prone position, with scattered secondary and
tertiary waves noted.

Targeted rapid sequence imaging of the cervical esophagus and
hypopharynx at real-time showed no laryngeal penetration or
aspiration.

However, contrast was identified within the proximal trachea
consistent with aspiration, though this did not occur on the
real-time swallows ; patient cleared this contrast with voluntary
coughing, with no spontaneous cough reflex noted.
IMPRESSION: Aspiration of contrast into the trachea with absent spontaneous
cough reflex.

This was cleared with voluntary coughing.

Diffuse esophageal dysmotility.

## 2017-12-07 ENCOUNTER — Encounter: Payer: Self-pay | Admitting: Internal Medicine

## 2018-04-23 ENCOUNTER — Ambulatory Visit: Payer: Medicare HMO | Admitting: Urology

## 2018-04-23 DIAGNOSIS — R3912 Poor urinary stream: Secondary | ICD-10-CM | POA: Diagnosis not present

## 2018-04-23 DIAGNOSIS — N401 Enlarged prostate with lower urinary tract symptoms: Secondary | ICD-10-CM | POA: Diagnosis not present

## 2018-08-20 ENCOUNTER — Ambulatory Visit (INDEPENDENT_AMBULATORY_CARE_PROVIDER_SITE_OTHER): Payer: Medicare HMO | Admitting: Urology

## 2018-08-20 DIAGNOSIS — N401 Enlarged prostate with lower urinary tract symptoms: Secondary | ICD-10-CM

## 2018-08-20 DIAGNOSIS — R3912 Poor urinary stream: Secondary | ICD-10-CM

## 2018-08-23 ENCOUNTER — Other Ambulatory Visit: Payer: Self-pay | Admitting: Urology

## 2018-08-30 ENCOUNTER — Other Ambulatory Visit (HOSPITAL_COMMUNITY)
Admission: RE | Admit: 2018-08-30 | Discharge: 2018-08-30 | Disposition: A | Discharge status: Home / Self Care | Payer: Medicare HMO | Source: Ambulatory Visit | Attending: Urology | Admitting: Urology

## 2018-08-30 DIAGNOSIS — Z01812 Encounter for preprocedural laboratory examination: Secondary | ICD-10-CM | POA: Insufficient documentation

## 2018-08-30 DIAGNOSIS — Z1159 Encounter for screening for other viral diseases: Secondary | ICD-10-CM | POA: Diagnosis not present

## 2018-08-30 LAB — SARS CORONAVIRUS 2 (TAT 6-24 HRS): SARS Coronavirus 2: NEGATIVE

## 2018-08-31 ENCOUNTER — Other Ambulatory Visit: Payer: Self-pay

## 2018-08-31 ENCOUNTER — Encounter (HOSPITAL_BASED_OUTPATIENT_CLINIC_OR_DEPARTMENT_OTHER): Payer: Self-pay | Admitting: *Deleted

## 2018-08-31 NOTE — Progress Notes (Signed)
Spoke w/ pt via phone for pre-op interview.  Npo after mn.  Arrive at 0730.  Needs istat and ekg.  Pt had covid test yesterday.  Reviewed [facility] guidelines and visitor restriction policy.  Pt to bring home medication.

## 2018-08-31 NOTE — Progress Notes (Signed)

## 2018-09-01 NOTE — H&P (Signed)
CC: I have symptoms of an enlarged prostate.  HPI: Noah Jennings is a 83 year-old male established patient who is here for symptoms of enlarged prostate.    Noah Jennings returns today in f/u. He has BPH with BOO and remains on tamsulosin bid. He has persistent LUTS with an IPSS of 22. He has not had a recurrent UTI since his last visit. He is to have cystoscopy today.   GU Hx: Noah Jennings is an 83 yo WM who was sent in consultation by Dr. Cindie Laroche for BPH with BOO. He has been on tamsulosin for several years. 6 weeks ago he had a cough and right pleuritic pain. He got two rounds of abx for that but as soon as he finished he got symptoms of a UTI. He was given abx and had his tamsulosin double. He started voiding better and didn't have to take the abx. He had no hematuria but he had some dysuria. He had increased hesitancy and weak stream particularly at night. He has had no prior UTI's, stones or GU surgery. His UA today is unremarkable. His IPSS is 34.     CC: AUA Questions Scoring.  HPI:     AUA Symptom Score: Less than 50% of the time he has the sensation of not emptying his bladder completely when finished urinating. 50% of the time he has to urinate again fewer than two hours after he has finished urinating. 50% of the time he has to start and stop again several times when he urinates. More than 50% of the time he finds it difficult to postpone urination. More than 50% of the time he has a weak urinary stream. 50% of the time he has to push or strain to begin urination. He has to get up to urinate 2 times from the time he goes to bed until the time he gets up in the morning.   Calculated AUA Symptom Score: 21    ALLERGIES: None   MEDICATIONS: Aspirin 81 mg tablet,chewable 1 tablet PO Daily  Tamsulosin Hcl 0.4 mg capsule 1 capsule PO Daily  Losartan Potassium 100 mg tablet 1 tablet PO Daily  Rosuvastatin Calcium 10 mg tablet 1 tablet PO Daily  Vitamin D3 125 mcg (5,000 unit) tablet 1  tablet PO Daily     GU PSH: Complex Uroflow - 04/23/2018     NON-GU PSH: None   GU PMH: BPH w/LUTS, He is emptying ok but has a very slow stream with severe LUTS on tamsulosin 0.8mg  daily. I am going to have him return for cystoscopy to assess his options for further therapy. - 04/23/2018 Weak Urinary Stream - 04/23/2018    NON-GU PMH: Asthma Hypercholesterolemia Hypertension    FAMILY HISTORY: Emphysema - Father Heart problem - Mother   SOCIAL HISTORY: Marital Status: Married Preferred Language: English; Race: White Current Smoking Status: Patient does not smoke anymore. Has not smoked since 03/28/1991. Smoked for 30 years. Smoked 1 pack per day.   Tobacco Use Assessment Completed: Used Tobacco in last 30 days? Drinks 3 caffeinated drinks per day. Patient's occupation is/was retired.    REVIEW OF SYSTEMS:    GU Review Male:   Patient reports burning/ pain with urination, get up at night to urinate, and have to strain to urinate . Patient denies frequent urination, hard to postpone urination, leakage of urine, stream starts and stops, trouble starting your stream, erection problems, and penile pain.  Gastrointestinal (Upper):   Patient denies nausea, vomiting, and indigestion/ heartburn.  Gastrointestinal (Lower):   Patient denies diarrhea and constipation.  Constitutional:   Patient denies fever, night sweats, weight loss, and fatigue.  Skin:   Patient denies skin rash/ lesion and itching.  Eyes:   Patient denies blurred vision and double vision.  Ears/ Nose/ Throat:   Patient denies sore throat and sinus problems.  Hematologic/Lymphatic:   Patient denies easy bruising and swollen glands.  Cardiovascular:   Patient denies leg swelling and chest pains.  Respiratory:   Patient denies cough and shortness of breath.  Endocrine:   Patient denies excessive thirst.  Musculoskeletal:   Patient denies back pain and joint pain.  Neurological:   Patient denies headaches and dizziness.   Psychologic:   Patient denies depression and anxiety.   VITAL SIGNS: None   MULTI-SYSTEM PHYSICAL EXAMINATION:    Constitutional: Well-nourished. No physical deformities. Normally developed. Good grooming.  Respiratory: Normal breath sounds. No labored breathing, no use of accessory muscles.   Cardiovascular: Regular rate and rhythm. No murmur, no gallop.      PAST DATA REVIEWED:  Source Of History:  Patient   PROCEDURES:         Flexible Cystoscopy - 52000  Risks, benefits, and some of the potential complications of the procedure were discussed. Cipro 500mg  given for antibiotic prophylaxis. 24ml of 2% lidocaine jelly was instilled intraurethrally.      Meatus:  Normal size. Normal location. Normal condition.  Urethra:  No strictures.  External Sphincter:  Normal.  Verumontanum:  Normal.  Prostate:  Obstructing. small median lobe. Moderate hyperplasia.  Bladder Neck:  Non-obstructing.  Ureteral Orifices:  Normal location. Normal size. Normal shape. Effluxed clear urine.  Bladder:  Moderate trabeculation with a small diverticulum. No tumors. Normal mucosa. No stones.      The procedure was well tolerated and there were no complications.         Urinalysis - 81003 Dipstick Dipstick Cont'd  Specimen: Voided Bilirubin: Neg  Color: Yellow Ketones: Neg  Appearance: Clear Blood: Trace Intact  Specific Gravity: 1.010 Protein: Neg  pH: 5.0 Urobilinogen: 0.2  Glucose: Neg Nitrites: Neg    Leukocyte Esterase: Neg    ASSESSMENT:      ICD-10 Details  1 GU:   BPH w/LUTS - N40.1 He has persistent severe LUTS with BPH and BOO with a small middle lobe on cystoscopy. I discussed the addition of finasteride, Urolift, Rezum and TURP and he would like to have a TURP. I reviewd the risks of a TURP including bleeding, infection, incontinence, stricture, need for secondary procedures, ejaculatory and erectile dysfunction, thrombotic events, fluid overload and anesthetic complications. I  explained that 95% of men will have relief of the obstructive symptoms and about 70% will have relief of the irritative symptoms.   2   Weak Urinary Stream - R39.12    PLAN:           Schedule Return Visit/Planned Activity: Next Available Appointment - Schedule Surgery             Note: TURP          Document Letter(s):  Created for Patient: Clinical Summary

## 2018-09-02 ENCOUNTER — Observation Stay (HOSPITAL_BASED_OUTPATIENT_CLINIC_OR_DEPARTMENT_OTHER)
Admission: RE | Admit: 2018-09-02 | Discharge: 2018-09-03 | Disposition: A | Discharge status: Home / Self Care | Payer: Medicare HMO | Attending: Urology | Admitting: Urology

## 2018-09-02 ENCOUNTER — Ambulatory Visit (HOSPITAL_BASED_OUTPATIENT_CLINIC_OR_DEPARTMENT_OTHER): Payer: Medicare HMO | Admitting: Anesthesiology

## 2018-09-02 ENCOUNTER — Encounter (HOSPITAL_COMMUNITY)
Admission: RE | Disposition: A | Discharge status: Home / Self Care | Payer: Self-pay | Source: Home / Self Care | Attending: Urology

## 2018-09-02 ENCOUNTER — Encounter (HOSPITAL_BASED_OUTPATIENT_CLINIC_OR_DEPARTMENT_OTHER): Payer: Self-pay | Admitting: Anesthesiology

## 2018-09-02 ENCOUNTER — Other Ambulatory Visit: Payer: Self-pay

## 2018-09-02 DIAGNOSIS — Z7982 Long term (current) use of aspirin: Secondary | ICD-10-CM | POA: Diagnosis not present

## 2018-09-02 DIAGNOSIS — Z79899 Other long term (current) drug therapy: Secondary | ICD-10-CM | POA: Insufficient documentation

## 2018-09-02 DIAGNOSIS — I10 Essential (primary) hypertension: Secondary | ICD-10-CM | POA: Diagnosis not present

## 2018-09-02 DIAGNOSIS — E7800 Pure hypercholesterolemia, unspecified: Secondary | ICD-10-CM | POA: Insufficient documentation

## 2018-09-02 DIAGNOSIS — Z87891 Personal history of nicotine dependence: Secondary | ICD-10-CM | POA: Diagnosis not present

## 2018-09-02 DIAGNOSIS — R3912 Poor urinary stream: Secondary | ICD-10-CM | POA: Insufficient documentation

## 2018-09-02 DIAGNOSIS — N401 Enlarged prostate with lower urinary tract symptoms: Principal | ICD-10-CM | POA: Insufficient documentation

## 2018-09-02 DIAGNOSIS — N138 Other obstructive and reflux uropathy: Secondary | ICD-10-CM | POA: Diagnosis not present

## 2018-09-02 HISTORY — DX: Personal history of other diseases of the respiratory system: Z87.09

## 2018-09-02 HISTORY — PX: TRANSURETHRAL RESECTION OF PROSTATE: SHX73

## 2018-09-02 HISTORY — DX: Alcohol abuse, in remission: F10.11

## 2018-09-02 HISTORY — DX: Bladder-neck obstruction: N32.0

## 2018-09-02 HISTORY — DX: Unspecified osteoarthritis, unspecified site: M19.90

## 2018-09-02 HISTORY — DX: Benign prostatic hyperplasia without lower urinary tract symptoms: N40.0

## 2018-09-02 HISTORY — DX: Personal history of other diseases of the digestive system: Z87.19

## 2018-09-02 HISTORY — DX: Other seasonal allergic rhinitis: J30.2

## 2018-09-02 LAB — POCT I-STAT 4, (NA,K, GLUC, HGB,HCT)
Glucose, Bld: 98 mg/dL (ref 70–99)
HCT: 44 % (ref 39.0–52.0)
Hemoglobin: 15 g/dL (ref 13.0–17.0)
Potassium: 4.3 mmol/L (ref 3.5–5.1)
Sodium: 144 mmol/L (ref 135–145)

## 2018-09-02 SURGERY — TURP (TRANSURETHRAL RESECTION OF PROSTATE)
Anesthesia: General

## 2018-09-02 MED ORDER — FENTANYL CITRATE (PF) 100 MCG/2ML IJ SOLN
INTRAMUSCULAR | Status: DC | PRN
  Administered 2018-09-02: 50 ug via INTRAVENOUS
  Administered 2018-09-02 (×2): 25 ug via INTRAVENOUS

## 2018-09-02 MED ORDER — FLEET ENEMA 7-19 GM/118ML RE ENEM
1.0000 | ENEMA | Freq: Once | RECTAL | Status: DC | PRN
  Filled 2018-09-02: qty 1

## 2018-09-02 MED ORDER — LOSARTAN POTASSIUM 50 MG PO TABS: 100.0000 mg | ORAL_TABLET | Freq: Every evening | ORAL | Status: DC

## 2018-09-02 MED ORDER — SENNOSIDES-DOCUSATE SODIUM 8.6-50 MG PO TABS
1.0000 | ORAL_TABLET | Freq: Every evening | ORAL | Status: DC | PRN
  Filled 2018-09-02: qty 1

## 2018-09-02 MED ORDER — STERILE WATER FOR IRRIGATION IR SOLN
Status: DC | PRN
  Administered 2018-09-02: 500 mL

## 2018-09-02 MED ORDER — DEXAMETHASONE SODIUM PHOSPHATE 10 MG/ML IJ SOLN
INTRAMUSCULAR | Status: AC
  Filled 2018-09-02: qty 1

## 2018-09-02 MED ORDER — MORPHINE SULFATE (PF) 2 MG/ML IV SOLN
2.0000 mg | INTRAVENOUS | Status: DC | PRN
  Filled 2018-09-02: qty 2

## 2018-09-02 MED ORDER — SODIUM CHLORIDE 0.9 % IR SOLN
Status: DC | PRN
  Administered 2018-09-02 (×4): 3000 mL

## 2018-09-02 MED ORDER — ONDANSETRON HCL 4 MG/2ML IJ SOLN
INTRAMUSCULAR | Status: AC
  Filled 2018-09-02: qty 2

## 2018-09-02 MED ORDER — LIDOCAINE HCL (CARDIAC) PF 100 MG/5ML IV SOSY
PREFILLED_SYRINGE | INTRAVENOUS | Status: DC | PRN
  Administered 2018-09-02: 40 mg via INTRAVENOUS

## 2018-09-02 MED ORDER — FENTANYL CITRATE (PF) 100 MCG/2ML IJ SOLN
25.0000 ug | INTRAMUSCULAR | Status: DC | PRN
  Filled 2018-09-02: qty 1

## 2018-09-02 MED ORDER — ACETAMINOPHEN 325 MG PO TABS
650.0000 mg | ORAL_TABLET | ORAL | Status: DC | PRN
  Filled 2018-09-02: qty 2

## 2018-09-02 MED ORDER — CEFAZOLIN SODIUM-DEXTROSE 2-4 GM/100ML-% IV SOLN
INTRAVENOUS | Status: AC
  Filled 2018-09-02: qty 100

## 2018-09-02 MED ORDER — LACTATED RINGERS IV SOLN
INTRAVENOUS | Status: DC
  Administered 2018-09-02: 08:00:00 via INTRAVENOUS
  Filled 2018-09-02: qty 1000

## 2018-09-02 MED ORDER — HYDROCODONE-ACETAMINOPHEN 5-325 MG PO TABS
1.0000 | ORAL_TABLET | ORAL | Status: DC | PRN
  Administered 2018-09-02: 1 via ORAL
  Filled 2018-09-02: qty 2
  Filled 2018-09-02: qty 1

## 2018-09-02 MED ORDER — SODIUM CHLORIDE 0.9 % IR SOLN
3000.0000 mL | Status: DC
  Filled 2018-09-02: qty 3000

## 2018-09-02 MED ORDER — FENTANYL CITRATE (PF) 100 MCG/2ML IJ SOLN
INTRAMUSCULAR | Status: AC
  Filled 2018-09-02: qty 2

## 2018-09-02 MED ORDER — BISACODYL 10 MG RE SUPP
10.0000 mg | Freq: Every day | RECTAL | Status: DC | PRN
  Filled 2018-09-02: qty 1

## 2018-09-02 MED ORDER — POTASSIUM CHLORIDE IN NACL 20-0.45 MEQ/L-% IV SOLN
INTRAVENOUS | Status: DC
  Administered 2018-09-02 – 2018-09-03 (×2): via INTRAVENOUS
  Filled 2018-09-02 (×4): qty 1000

## 2018-09-02 MED ORDER — DEXAMETHASONE SODIUM PHOSPHATE 4 MG/ML IJ SOLN
INTRAMUSCULAR | Status: DC | PRN
  Administered 2018-09-02: 5 mg via INTRAVENOUS

## 2018-09-02 MED ORDER — DIPHENHYDRAMINE HCL 50 MG/ML IJ SOLN
12.5000 mg | Freq: Four times a day (QID) | INTRAMUSCULAR | Status: DC | PRN
  Filled 2018-09-02: qty 0.25

## 2018-09-02 MED ORDER — 0.9 % SODIUM CHLORIDE (POUR BTL) OPTIME
TOPICAL | Status: DC | PRN
  Administered 2018-09-02: 1000 mL

## 2018-09-02 MED ORDER — DOCUSATE SODIUM 100 MG PO CAPS
100.0000 mg | ORAL_CAPSULE | Freq: Two times a day (BID) | ORAL | Status: DC
  Administered 2018-09-02 – 2018-09-03 (×2): 100 mg via ORAL
  Filled 2018-09-02 (×3): qty 1

## 2018-09-02 MED ORDER — PROPOFOL 10 MG/ML IV BOLUS
INTRAVENOUS | Status: DC | PRN
  Administered 2018-09-02: 30 mg via INTRAVENOUS
  Administered 2018-09-02: 120 mg via INTRAVENOUS

## 2018-09-02 MED ORDER — ONDANSETRON HCL 4 MG/2ML IJ SOLN
INTRAMUSCULAR | Status: DC | PRN
  Administered 2018-09-02: 4 mg via INTRAVENOUS

## 2018-09-02 MED ORDER — LIDOCAINE 2% (20 MG/ML) 5 ML SYRINGE
INTRAMUSCULAR | Status: AC
  Filled 2018-09-02: qty 5

## 2018-09-02 MED ORDER — ROSUVASTATIN CALCIUM 10 MG PO TABS
10.0000 mg | ORAL_TABLET | Freq: Every evening | ORAL | Status: DC
  Administered 2018-09-02: 10 mg via ORAL
  Filled 2018-09-02: qty 1

## 2018-09-02 MED ORDER — CEFAZOLIN SODIUM-DEXTROSE 2-4 GM/100ML-% IV SOLN
2.0000 g | INTRAVENOUS | Status: AC
  Administered 2018-09-02: 11:00:00 2 g via INTRAVENOUS
  Filled 2018-09-02: qty 100

## 2018-09-02 MED ORDER — PROPOFOL 10 MG/ML IV BOLUS
INTRAVENOUS | Status: AC
  Filled 2018-09-02: qty 20

## 2018-09-02 MED ORDER — ZOLPIDEM TARTRATE 5 MG PO TABS
5.0000 mg | ORAL_TABLET | Freq: Every evening | ORAL | Status: DC | PRN
  Filled 2018-09-02: qty 1

## 2018-09-02 MED ORDER — LOSARTAN POTASSIUM 50 MG PO TABS
100.0000 mg | ORAL_TABLET | Freq: Every day | ORAL | Status: DC
  Administered 2018-09-02 – 2018-09-03 (×2): 100 mg via ORAL
  Filled 2018-09-02 (×2): qty 2

## 2018-09-02 MED ORDER — OXYBUTYNIN CHLORIDE 5 MG PO TABS
5.0000 mg | ORAL_TABLET | Freq: Three times a day (TID) | ORAL | Status: DC | PRN
  Filled 2018-09-02: qty 1

## 2018-09-02 MED ORDER — DIPHENHYDRAMINE HCL 12.5 MG/5ML PO ELIX
12.5000 mg | ORAL_SOLUTION | Freq: Four times a day (QID) | ORAL | Status: DC | PRN
  Filled 2018-09-02: qty 5

## 2018-09-02 MED ORDER — ONDANSETRON HCL 4 MG/2ML IJ SOLN
4.0000 mg | INTRAMUSCULAR | Status: DC | PRN
  Filled 2018-09-02: qty 2

## 2018-09-02 SURGICAL SUPPLY — 20 items
BAG DRAIN URO-CYSTO SKYTR STRL (DRAIN) ×3 IMPLANT
BAG URINE DRAINAGE (UROLOGICAL SUPPLIES) ×2 IMPLANT
CATH FOLEY 3WAY 30CC 22FR (CATHETERS) ×2 IMPLANT
CLOTH BEACON ORANGE TIMEOUT ST (SAFETY) ×3 IMPLANT
EVACUATOR MICROVAS BLADDER (UROLOGICAL SUPPLIES) ×2 IMPLANT
GLOVE SURG SS PI 8.0 STRL IVOR (GLOVE) ×3 IMPLANT
GOWN STRL REUS W/ TWL XL LVL3 (GOWN DISPOSABLE) ×1 IMPLANT
GOWN STRL REUS W/TWL XL LVL3 (GOWN DISPOSABLE) ×2
IV NS IRRIG 3000ML ARTHROMATIC (IV SOLUTION) ×8 IMPLANT
KIT TURNOVER CYSTO (KITS) ×3 IMPLANT
LOOP CUT BIPOLAR 24F LRG (ELECTROSURGICAL) ×2 IMPLANT
MANIFOLD NEPTUNE II (INSTRUMENTS) ×2 IMPLANT
NS IRRIG 1000ML POUR BTL (IV SOLUTION) ×2 IMPLANT
PACK CYSTO (CUSTOM PROCEDURE TRAY) ×3 IMPLANT
SYR 50ML LL SCALE MARK (SYRINGE) ×2 IMPLANT
SYRINGE IRR TOOMEY STRL 70CC (SYRINGE) ×2 IMPLANT
TUBE CONNECTING 12'X1/4 (SUCTIONS) ×1
TUBE CONNECTING 12X1/4 (SUCTIONS) ×1 IMPLANT
TUBING UROLOGY SET (TUBING) ×2 IMPLANT
WATER STERILE IRR 500ML POUR (IV SOLUTION) ×2 IMPLANT

## 2018-09-02 NOTE — Transfer of Care (Signed)
Immediate Anesthesia Transfer of Care Note  Patient: Noah Jennings  Procedure(s) Performed: Procedure(s) (LRB): TRANSURETHRAL RESECTION OF THE PROSTATE (TURP) (N/A)  Patient Location: PACU  Anesthesia Type: General  Level of Consciousness: awake, sedated, patient cooperative and responds to stimulation  Airway & Oxygen Therapy: Patient Spontanous Breathing and Patient connected to [place] o2 and soft mask   Post-op Assessment: Report given to PACU RN, Post -op Vital signs reviewed and stable and Patient moving all extremities  Post vital signs: Reviewed and stable  Complications: No apparent anesthesia complications

## 2018-09-02 NOTE — Interval H&P Note (Signed)
History and Physical Interval Note:  09/02/2018 10:48 AM  Noah Jennings  has presented today for surgery, with the diagnosis of BENIGN PROSTATIC HYPERPLASIA WITH BLADDER OUTLET OBSTRUCTION.  The various methods of treatment have been discussed with the patient and family. After consideration of risks, benefits and other options for treatment, the patient has consented to  Procedure(s): TRANSURETHRAL RESECTION OF THE PROSTATE (TURP) (N/A) as a surgical intervention.  The patient's history has been reviewed, patient examined, no change in status, stable for surgery.  I have reviewed the patient's chart and labs.  Questions were answered to the patient's satisfaction.     Irine Seal

## 2018-09-02 NOTE — OR Nursing (Signed)
Telephone report given to ginger epling rn

## 2018-09-02 NOTE — Op Note (Signed)
Procedure: Transurethral resection of the prostate.  Preop diagnosis: BPH with bladder outlet obstruction.  Postop diagnosis: Same.  Surgeon: Dr. Irine Seal.  Anesthesia: General.  Specimen: Prostate chips.  Drains: 22 French three-way Foley catheter.  EBL: Approximately 50 mL.  Complications: None.  Indications: The patient is an 83 year old male with BPH with bladder outlet obstruction and persistent severe symptoms on medical therapy.  He is elected TURP for further therapy.  Procedure: He was given 2 g of Ancef.  He was taken operating room where general anesthetic was induced.  He was placed in lithotomy position and fitted with PAS hose.  His perineum and genitalia were prepped with Betadine solution and he was draped in usual sterile fashion.  Cystoscopy was performed using the 23 [place] scope with a 30 degree lens.  Examination revealed a normal urethra.  The external sphincter was intact.  The prostatic urethra was approximately 2 to 3 cm in length with a high bladder neck and small middle lobe with some lateral lobe enlargement and obstruction.  Examination of bladder revealed mild to moderate trabeculation without tumors, stones or inflammation.  Ureteral orifices were unremarkable.  The cystoscope was replaced with the 26 French continuous flow resectoscope sheath with the aid of a visual obturator.  The scope was then fitted with an Beatrix Fetters handle with a bipolar loop and the 30 degree lens.  Saline was used as the irrigant.  The resection was initiated the bladder neck where the middle lobe was resected and the bladder neck fibers were exposed from 5-7 o'clock.  The floor the prostate was then resected out to alongside the verumontanum.  The left lobe of the prostate was resected from bladder neck to apex out the capsular fibers.  The right lobe of the prostate was resected from bladder neck to apex out to the capsular fibers.  The bladder was evacuated free of chips.  Some  additional apical and anterior tissue was resected and those chips were retrieved.  Hemostasis was secured and final inspection demonstrated no retained tissue, intact ureteral orifices, no active bleeding and an intact external sphincter.  The scope was removed and pressure on the bladder produced an excellent stream.  A 22 French three-way Foley catheter was placed with the aid of a catheter guide.  The balloon was filled with 30 mL of sterile fluid.  The catheter was irrigated with clear return and placed to continuous irrigation and straight drainage.  He was taken down from lithotomy position, his anesthetic was reversed and he was moved to recovery in stable condition.  There were no complications.

## 2018-09-02 NOTE — Discharge Instructions (Signed)
Transurethral Resection of the Prostate, Care After °This sheet gives you information about how to care for yourself after your procedure. Your health care provider may also give you more specific instructions. If you have problems or questions, contact your health care provider. °What can I expect after the procedure? °After the procedure, it is common to have: °· Mild pain in your lower abdomen. °· Soreness or mild discomfort in your penis from having the catheter inserted during the procedure. °· A feeling of urgency when you need to urinate. °· A small amount of blood in your urine. You may notice some small blood clots in your urine. These are normal. °Follow these instructions at home: °Medicines °· Take over-the-counter and prescription medicines only as told by your health care provider. °· If you were prescribed an antibiotic medicine, take it as told by your health care provider. Do not stop taking the antibiotic even if you start to feel better. °· Ask your health care provider if the medicine prescribed to you: °? Requires you to avoid driving or using heavy machinery. °? Can cause constipation. You may need to take actions to prevent or treat constipation, such as: °§ Take over-the-counter or prescription medicines. °§ Eat foods that are high in fiber, such as fresh fruits and vegetables, whole grains, and beans. °§ Limit foods that are high in fat and processed sugars, such as fried or sweet foods. °· Do not drive for 24 hours if you were given a sedative during your procedure. °Activity ° °· Return to your normal activities as told by your health care provider. Ask your health care provider what activities are safe for you. °· Do not lift anything that is heavier than 10 lb (4.5 kg), or the limit that you are told, for 3 weeks after the procedure or until your health care provider says that it is safe. °· Avoid intense physical activity for as long as told by your health care provider. °· Avoid  sitting for a long time without moving. Get up and move around one or more times every few hours. This helps to prevent blood clots. You may increase your physical activity gradually as you start to feel better. °Lifestyle °· Do not drink alcohol for as long as told by your health care provider. This is especially important if you are taking prescription pain medicines. °· Do not engage in sexual activity until your health care provider says that you can do this. °General instructions ° °· Do not take baths, swim, or use a hot tub until your health care provider approves. °· Drink enough fluid to keep your urine pale yellow. °· Urinate as soon as you feel the need to. Do not try to hold your urine for long periods of time. °· If your health care provider approves, you may take a stool softener for 2-3 weeks to prevent you from straining to have a bowel movement. °· Wear compression stockings as told by your health care provider. These stockings help to prevent blood clots and reduce swelling in your legs. °· Keep all follow-up visits as told by your health care provider. This is important. °Contact a health care provider if you have: °· Difficulty urinating. °· A fever. °· Pain that gets worse or does not improve with medicine. °· Blood in your urine that does not go away after 1 week of resting and drinking more fluids. °· Swelling in your penis or testicles. °Get help right away if: °· You are unable   to urinate.  You are having more blood clots in your urine instead of fewer.  You have: ? Large blood clots. ? A lot of blood in your urine. ? Pain in your back or lower abdomen. ? Pain or swelling in your legs. ? Chills and you are shaking. ? Difficulty breathing or shortness of breath. Summary  After the procedure, it is common to have a small amount of blood in your urine.  Avoid heavy lifting and intense physical activity for as long as told by your health care provider.  Urinate as soon as you  feel the need to. Do not try to hold your urine for long periods of time.  Keep all follow-up visits as told by your health care provider. This is important.  Hold your aspirin for 2 weeks.  This information is not intended to replace advice given to you by your health care provider. Make sure you discuss any questions you have with your health care provider. Document Released: 02/10/2005 Document Revised: 06/02/2018 Document Reviewed: 11/11/2017 Elsevier Patient Education  2020 [employer].

## 2018-09-02 NOTE — Anesthesia Procedure Notes (Signed)
Procedure Name: LMA Insertion Date/Time: 09/02/2018 11:02 AM Performed by: Justice Rocher, CRNA Pre-anesthesia Checklist: Patient identified, Emergency Drugs available, Suction available and Patient being monitored Patient Re-evaluated:Patient Re-evaluated prior to induction Oxygen Delivery Method: Circle system utilized Preoxygenation: Pre-oxygenation with 100% oxygen Induction Type: IV induction Ventilation: Mask ventilation without difficulty LMA: LMA inserted LMA Size: 5.0 Number of attempts: 1 Airway Equipment and Method: Bite block Placement Confirmation: positive ETCO2 and breath sounds checked- equal and bilateral Tube secured with: Tape Dental Injury: Teeth and Oropharynx as per pre-operative assessment

## 2018-09-02 NOTE — Anesthesia Postprocedure Evaluation (Signed)
Anesthesia Post Note  Patient: Noah Jennings  Procedure(s) Performed: TRANSURETHRAL RESECTION OF THE PROSTATE (TURP) (N/A )     Patient location during evaluation: PACU Anesthesia Type: General Level of consciousness: awake and alert Pain management: pain level controlled Vital Signs Assessment: post-procedure vital signs reviewed and stable Respiratory status: spontaneous breathing, nonlabored ventilation, respiratory function stable and patient connected to nasal cannula oxygen Cardiovascular status: blood pressure returned to baseline and stable Postop Assessment: no apparent nausea or vomiting Anesthetic complications: no    Last Vitals:  Vitals:   09/02/18 1315 09/02/18 1345  BP: (!) 148/63 (!) 142/61  Pulse: (!) 55 (!) 56  Resp: 12 18  Temp:  36.7 C  SpO2: 97% 98%    Last Pain:  Vitals:   09/02/18 1345  TempSrc:   PainSc: 0-No pain                 Effie Berkshire

## 2018-09-02 NOTE — Anesthesia Preprocedure Evaluation (Addendum)
Anesthesia Evaluation  Patient identified by MRN, date of birth, ID band Patient awake    Reviewed: Allergy & Precautions, NPO status , Patient's Chart, lab work & pertinent test results  Airway Mallampati: I  TM Distance: >3 FB Neck ROM: Full    Dental  (+) Lower Dentures, Upper Dentures   Pulmonary former smoker,    breath sounds clear to auscultation       Cardiovascular hypertension, Pt. on medications  Rhythm:Regular Rate:Normal     Neuro/Psych negative neurological ROS     GI/Hepatic negative GI ROS, Neg liver ROS,   Endo/Other  negative endocrine ROS  Renal/GU negative Renal ROS     Musculoskeletal  (+) Arthritis ,   Abdominal Normal abdominal exam  (+)   Peds  Hematology negative hematology ROS (+)   Anesthesia Other Findings   Reproductive/Obstetrics                            Anesthesia Physical Anesthesia Plan  ASA: III  Anesthesia Plan: General   Post-op Pain Management:    Induction: Intravenous  PONV Risk Score and Plan: 3 and Ondansetron and Treatment may vary due to age or medical condition  Airway Management Planned: LMA  Additional Equipment: None  Intra-op Plan:   Post-operative Plan: Extubation in OR  Informed Consent: I have reviewed the patients History and Physical, chart, labs and discussed the procedure including the risks, benefits and alternatives for the proposed anesthesia with the patient or authorized representative who has indicated his/her understanding and acceptance.     Dental advisory given  Plan Discussed with: CRNA  Anesthesia Plan Comments: (COVID-19 Labs  No results for input(s): DDIMER, FERRITIN, LDH, CRP in the last 72 hours.  Lab Results      Component                Value               Date                      SARSCOV2NAA              NEGATIVE            08/30/2018            )       Anesthesia Quick  Evaluation

## 2018-09-02 NOTE — Progress Notes (Signed)
He is doing well without significant pain.  Urine light pink on slow CBI.  .BP (!) 142/61   Pulse (!) 56   Temp 98 F (36.7 C)   Resp 18   Ht 5\' 6"  (1.676 m)   Wt 74.6 kg   SpO2 98%   BMI 26.55 kg/m   Plan for foley removal and discharge when voiding in am.  No scripts for d/c.

## 2018-09-03 DIAGNOSIS — N401 Enlarged prostate with lower urinary tract symptoms: Secondary | ICD-10-CM | POA: Diagnosis not present

## 2018-09-03 NOTE — Discharge Summary (Signed)
Physician Discharge Summary  Patient ID: NOA CONSTANTE MRN: [id number] DOB/AGE: [birth year] 83 y.o.  Admit date: 09/02/2018 Discharge date: 09/03/2018  Admission Diagnoses:  Discharge Diagnoses:  Active Problems:   BPH with urinary obstruction   Discharged Condition: good  Hospital Course: 83 year old male status post transurethral resection of prostate.  He did well overnight.  CBI was weaned.  He passed a voiding trial the next day.  He was stable for discharge.  Consults: None  Significant Diagnostic Studies: none  Treatments: surgery: as above  Discharge Exam: Blood pressure (!) 138/58, pulse 62, temperature 98.1 F (36.7 C), resp. rate 16, height 5\' 6"  (1.676 m), weight 74.6 kg, SpO2 97 %. General appearance: alertno acute distress Adequate perfusion of extremities Nonlabored respiration Abdomen soft, nontender, nondistended  Disposition: Discharge disposition: 01-Home or Self Care        Allergies as of 09/03/2018   No Known Allergies     Medication List    TAKE these medications   Allergy 10 MG tablet Generic drug: loratadine Take 10 mg by mouth daily as needed for allergies.   aspirin EC 81 MG tablet Take 81 mg by mouth every evening.   losartan 50 MG tablet Commonly known as: COZAAR Take 100 mg by mouth daily.   pantoprazole 40 MG tablet Commonly known as: PROTONIX Take 40 mg by mouth daily.   rosuvastatin 10 MG tablet Commonly known as: CRESTOR Take 10 mg by mouth every evening.   Vitamin D3 125 MCG (5000 UT) Caps Take 1 capsule by mouth every evening.      Follow-up Information    [facility] Follow up.   Why: as scheduled. Contact information: [place] [employer] [phone]          Signed: Marton Redwood, III 09/03/2018, 10:18 AM

## 2018-09-03 NOTE — Progress Notes (Signed)
Discussed with patient discharge instructions, he verbalized agreement and understanding.  Patient left with all belongings to go home in private vehicle.

## 2018-09-06 ENCOUNTER — Encounter (HOSPITAL_BASED_OUTPATIENT_CLINIC_OR_DEPARTMENT_OTHER): Payer: Self-pay | Admitting: Urology

## 2018-09-24 ENCOUNTER — Other Ambulatory Visit: Payer: Self-pay

## 2018-09-24 ENCOUNTER — Other Ambulatory Visit (HOSPITAL_COMMUNITY)
Admission: RE | Admit: 2018-09-24 | Discharge: 2018-09-24 | Disposition: A | Discharge status: Home / Self Care | Payer: Medicare HMO | Source: Ambulatory Visit | Attending: Urology | Admitting: Urology

## 2018-09-24 ENCOUNTER — Ambulatory Visit (INDEPENDENT_AMBULATORY_CARE_PROVIDER_SITE_OTHER): Payer: Medicare HMO | Admitting: Urology

## 2018-09-24 DIAGNOSIS — N401 Enlarged prostate with lower urinary tract symptoms: Secondary | ICD-10-CM

## 2018-09-24 DIAGNOSIS — R3912 Poor urinary stream: Secondary | ICD-10-CM

## 2018-09-25 LAB — URINE CULTURE: Culture: <10000 — AB

## 2019-02-03 ENCOUNTER — Other Ambulatory Visit: Payer: Self-pay

## 2019-02-03 DIAGNOSIS — Z20822 Contact with and (suspected) exposure to covid-19: Secondary | ICD-10-CM

## 2019-02-04 LAB — NOVEL CORONAVIRUS, NAA: SARS-CoV-2, NAA: DETECTED — AB

## 2019-02-06 ENCOUNTER — Telehealth: Payer: Self-pay | Admitting: Unknown Physician Specialty

## 2019-02-06 NOTE — Telephone Encounter (Signed)
Called to discuss with patient about Covid symptoms and the use of bamlanivimab, a monoclonal antibody infusion for those with mild to moderate Covid symptoms and at a high risk of hospitalization.  Pt is qualified for this infusion at the [facility] infusion center due to Age > 65   Left message to call back

## 2019-03-11 ENCOUNTER — Encounter: Payer: Self-pay | Admitting: Urology

## 2019-03-11 ENCOUNTER — Ambulatory Visit (INDEPENDENT_AMBULATORY_CARE_PROVIDER_SITE_OTHER): Payer: Medicare HMO | Admitting: Urology

## 2019-03-11 ENCOUNTER — Other Ambulatory Visit: Payer: Self-pay

## 2019-03-11 VITALS — BP 166/74 | HR 80 | Temp 98.1°F | Ht 66.0 in | Wt 165.0 lb

## 2019-03-11 DIAGNOSIS — N138 Other obstructive and reflux uropathy: Secondary | ICD-10-CM

## 2019-03-11 DIAGNOSIS — R351 Nocturia: Secondary | ICD-10-CM | POA: Diagnosis not present

## 2019-03-11 DIAGNOSIS — N401 Enlarged prostate with lower urinary tract symptoms: Secondary | ICD-10-CM | POA: Diagnosis not present

## 2019-03-11 LAB — POCT URINALYSIS DIPSTICK
Bilirubin, UA: NEGATIVE
Glucose, UA: NEGATIVE
Ketones, UA: NEGATIVE
Leukocytes, UA: NEGATIVE
Nitrite, UA: NEGATIVE
Protein, UA: POSITIVE — AB
Spec Grav, UA: >=1.03 — AB (ref 1.010–1.025)
Urobilinogen, UA: NEGATIVE E.U./dL — AB
pH, UA: 6 (ref 5.0–8.0)

## 2019-03-11 NOTE — Progress Notes (Signed)
Subjective:  1. BPH with urinary obstruction   2. Nocturia     He is doing well 6 months s/p TURP with minimal LUTS and an IPSS of 4.  UA is unremarkable today.    IPSS    Row Name 03/11/19 0800         International Prostate Symptom Score   How often have you had the sensation of not emptying your bladder?  Not at All     How often have you had to urinate less than every two hours?  Less than half the time     How often have you found you stopped and started again several times when you urinated?  Not at All     How often have you found it difficult to postpone urination?  Less than 1 in 5 times     How often have you had a weak urinary stream?  Not at All     How often have you had to strain to start urination?  Not at All     How many times did you typically get up at night to urinate?  1 Time     Total IPSS Score  4       Quality of Life due to urinary symptoms   If you were to spend the rest of your life with your urinary condition just the way it is now how would you feel about that?  Delighted         ROS:  ROS  No Known Allergies  Outpatient Encounter Medications as of 03/11/2019  Medication Sig  . aspirin EC 81 MG tablet Take 81 mg by mouth every evening.  . Cholecalciferol (VITAMIN D3) 125 MCG (5000 UT) CAPS Take 1 capsule by mouth every evening.  Marland Kitchen losartan (COZAAR) 50 MG tablet Take 100 mg by mouth daily.   . rosuvastatin (CRESTOR) 10 MG tablet Take 10 mg by mouth every evening.   . [DISCONTINUED] loratadine (ALLERGY) 10 MG tablet Take 10 mg by mouth daily as needed for allergies.  . [DISCONTINUED] pantoprazole (PROTONIX) 40 MG tablet Take 40 mg by mouth daily.   No facility-administered encounter medications on file as of 03/11/2019.    Past Medical History:  Diagnosis Date  . Arthritis   . Bladder outlet obstruction   . BPH (benign prostatic hyperplasia)   . History of alcohol abuse    per pt quit 1998  . History of chronic bronchitis   . History of  pancreatitis    remoted hx alcoholic pancreatitis  . HTN (hypertension)   . Hx of adenomatous colonic polyps   . Hyperlipidemia   . Seasonal allergies     Past Surgical History:  Procedure Laterality Date  . COLONOSCOPY N/A 01/11/2013   Procedure: COLONOSCOPY;  Surgeon: Daneil Dolin, MD;  Location: [facility];  Service: Endoscopy;  Laterality: N/A;  11:15  . TRANSURETHRAL RESECTION OF PROSTATE N/A 09/02/2018   Procedure: TRANSURETHRAL RESECTION OF THE PROSTATE (TURP);  Surgeon: Irine Seal, MD;  Location: [facility];  Service: Urology;  Laterality: N/A;    Social History   Socioeconomic History  . Marital status: Married    Spouse name: Not on file  . Number of children: 2  . Years of education: Not on file  . Highest education level: Not on file  Occupational History  . Not on file  Tobacco Use  . Smoking status: Former Smoker    Years: 45.00    Types:  Cigarettes    Start date: 03/24/1996    Quit date: 08/30/1996    Years since quitting: 22.5  . Smokeless tobacco: Never Used  Substance and Sexual Activity  . Alcohol use: Not Currently    Comment: quit 1998  . Drug use: Never  . Sexual activity: Not on file  Other Topics Concern  . Not on file  Social History Narrative  . Not on file   Social Determinants of Health   Financial Resource Strain:   . Difficulty of Paying Living Expenses: Not on file  Food Insecurity:   . Worried About Charity fundraiser in the Last Year: Not on file  . Ran Out of Food in the Last Year: Not on file  Transportation Needs:   . Lack of Transportation (Medical): Not on file  . Lack of Transportation (Non-Medical): Not on file  Physical Activity:   . Days of Exercise per Week: Not on file  . Minutes of Exercise per Session: Not on file  Stress:   . Feeling of Stress : Not on file  Social Connections:   . Frequency of Communication with Friends and Family: Not on file  . Frequency of Social Gatherings with Friends  and Family: Not on file  . Attends Religious Services: Not on file  . Active Member of Clubs or Organizations: Not on file  . Attends Archivist Meetings: Not on file  . Marital Status: Not on file  Intimate Partner Violence:   . Fear of Current or Ex-Partner: Not on file  . Emotionally Abused: Not on file  . Physically Abused: Not on file  . Sexually Abused: Not on file    Family History  Problem Relation Age of Onset  . Colon cancer Neg Hx        Objective: Vitals:   03/11/19 0819  BP: (!) 166/74  Pulse: 80  Temp: 98.1 F (36.7 C)     Physical Exam  Lab Results:  Results for orders placed or performed in visit on 03/11/19 (from the past 24 hour(s))  POCT urinalysis dipstick     Status: Abnormal   Collection Time: 03/11/19  8:27 AM  Result Value Ref Range   Color, UA yellow    Clarity, UA     Glucose, UA Negative Negative   Bilirubin, UA neg    Ketones, UA neg    Spec Grav, UA >=1.030 (A) 1.010 - 1.025   Blood, UA trace intact    pH, UA 6.0 5.0 - 8.0   Protein, UA Positive (A) Negative   Urobilinogen, UA negative (A) 0.2 or 1.0 E.U./dL   Nitrite, UA neg    Leukocytes, UA Negative Negative   Appearance clear    Odor      BMET No results for input(s): NA, K, CL, CO2, GLUCOSE, BUN, CREATININE, CALCIUM in the last 72 hours. PSA      Studies/Results: No results found.    Assessment & Plan: BPH with urinary obstruction He is doing well 6 months post TURP.  His IPSS is down to 4.  He has some frequency and nocturia only 1x.  He has had no hematuria or dysuria.   He remains off of flomax. .   Nocturia He has minimal nocturia and frequency.    No orders of the defined types were placed in this encounter.    Orders Placed This Encounter  Procedures  . POCT urinalysis dipstick      Return if symptoms worsen or  fail to improve.   CC: Lucia Gaskins, MD      Irine Seal 03/11/2019

## 2019-03-11 NOTE — Assessment & Plan Note (Addendum)
He is doing well 6 months post TURP.  His IPSS is down to 4.  He has some frequency and nocturia only 1x.  He has had no hematuria or dysuria.   He remains off of flomax. Marland Kitchen

## 2019-03-11 NOTE — Assessment & Plan Note (Signed)
He has minimal nocturia and frequency.

## 2019-08-22 ENCOUNTER — Other Ambulatory Visit (HOSPITAL_COMMUNITY)
Admission: RE | Admit: 2019-08-22 | Discharge: 2019-08-22 | Disposition: A | Discharge status: Home / Self Care | Payer: Medicare HMO | Source: Ambulatory Visit | Attending: Internal Medicine | Admitting: Internal Medicine

## 2019-08-22 ENCOUNTER — Other Ambulatory Visit: Payer: Self-pay

## 2019-08-22 DIAGNOSIS — D510 Vitamin B12 deficiency anemia due to intrinsic factor deficiency: Secondary | ICD-10-CM | POA: Insufficient documentation

## 2019-08-22 DIAGNOSIS — I110 Hypertensive heart disease with heart failure: Secondary | ICD-10-CM | POA: Diagnosis present

## 2019-08-22 DIAGNOSIS — R972 Elevated prostate specific antigen [PSA]: Secondary | ICD-10-CM | POA: Insufficient documentation

## 2019-08-22 DIAGNOSIS — E559 Vitamin D deficiency, unspecified: Secondary | ICD-10-CM | POA: Diagnosis not present

## 2019-08-22 DIAGNOSIS — R5383 Other fatigue: Secondary | ICD-10-CM | POA: Insufficient documentation

## 2019-08-22 DIAGNOSIS — E7849 Other hyperlipidemia: Secondary | ICD-10-CM | POA: Diagnosis present

## 2019-08-22 LAB — COMPREHENSIVE METABOLIC PANEL
ALT: 24 U/L (ref 0–44)
AST: 25 U/L (ref 15–41)
Albumin: 4.3 g/dL (ref 3.5–5.0)
Alkaline Phosphatase: 64 U/L (ref 38–126)
Anion gap: 12 (ref 5–15)
BUN: 16 mg/dL (ref 8–23)
CO2: 26 mmol/L (ref 22–32)
Calcium: 9.6 mg/dL (ref 8.9–10.3)
Chloride: 103 mmol/L (ref 98–111)
Creatinine, Ser: 0.95 mg/dL (ref 0.61–1.24)
GFR calc Af Amer: >60 mL/min (ref >60)
GFR calc non Af Amer: >60 mL/min (ref >60)
Glucose, Bld: 112 mg/dL — ABNORMAL HIGH (ref 70–99)
Potassium: 4.4 mmol/L (ref 3.5–5.1)
Sodium: 141 mmol/L (ref 135–145)
Total Bilirubin: 0.8 mg/dL (ref 0.3–1.2)
Total Protein: 7.4 g/dL (ref 6.5–8.1)

## 2019-08-22 LAB — LIPID PANEL
Cholesterol: 137 mg/dL (ref 0–200)
HDL: 49 mg/dL (ref >40)
LDL Cholesterol: 72 mg/dL (ref 0–99)
Total CHOL/HDL Ratio: 2.8 RATIO
Triglycerides: 80 mg/dL (ref <150)
VLDL: 16 mg/dL (ref 0–40)

## 2019-08-22 LAB — CBC
HCT: 43.9 % (ref 39.0–52.0)
Hemoglobin: 14.5 g/dL (ref 13.0–17.0)
MCH: 30.2 pg (ref 26.0–34.0)
MCHC: 33 g/dL (ref 30.0–36.0)
MCV: 91.5 fL (ref 80.0–100.0)
Platelets: 196 10*3/uL (ref 150–400)
RBC: 4.8 MIL/uL (ref 4.22–5.81)
RDW: 12.3 % (ref 11.5–15.5)
WBC: 8.6 10*3/uL (ref 4.0–10.5)
nRBC: 0 % (ref 0.0–0.2)

## 2019-08-22 LAB — T4, FREE: Free T4: 0.68 ng/dL (ref 0.61–1.12)

## 2019-08-22 LAB — PSA: Prostatic Specific Antigen: 0.33 ng/mL (ref 0.00–4.00)

## 2019-08-22 LAB — TSH: TSH: 5.592 u[IU]/mL — ABNORMAL HIGH (ref 0.350–4.500)

## 2019-08-22 LAB — VITAMIN D 25 HYDROXY (VIT D DEFICIENCY, FRACTURES): Vit D, 25-Hydroxy: 78.54 ng/mL (ref 30–100)

## 2022-10-31 ENCOUNTER — Emergency Department (HOSPITAL_COMMUNITY)
Admission: EM | Admit: 2022-10-31 | Discharge: 2022-10-31 | Disposition: A | Discharge status: Home / Self Care | Payer: Medicare HMO | Attending: Emergency Medicine | Admitting: Emergency Medicine

## 2022-10-31 DIAGNOSIS — Z7982 Long term (current) use of aspirin: Secondary | ICD-10-CM | POA: Diagnosis not present

## 2022-10-31 DIAGNOSIS — R21 Rash and other nonspecific skin eruption: Secondary | ICD-10-CM | POA: Diagnosis present

## 2022-10-31 DIAGNOSIS — B0230 Zoster ocular disease, unspecified: Secondary | ICD-10-CM

## 2022-10-31 DIAGNOSIS — B0239 Other herpes zoster eye disease: Secondary | ICD-10-CM | POA: Insufficient documentation

## 2022-10-31 MED ORDER — FLUORESCEIN SODIUM 1 MG OP STRP
1.0000 | ORAL_STRIP | Freq: Once | OPHTHALMIC | Status: AC
  Administered 2022-10-31: 1 via OPHTHALMIC
  Filled 2022-10-31: qty 1

## 2022-10-31 MED ORDER — ONDANSETRON 4 MG PO TBDP
4.0000 mg | ORAL_TABLET | Freq: Once | ORAL | Status: AC
  Administered 2022-10-31: 4 mg via ORAL
  Filled 2022-10-31: qty 1

## 2022-10-31 MED ORDER — TETRACAINE HCL 0.5 % OP SOLN
1.0000 [drp] | Freq: Once | OPHTHALMIC | Status: AC
  Administered 2022-10-31: 1 [drp] via OPHTHALMIC
  Filled 2022-10-31: qty 4

## 2022-10-31 MED ORDER — VALACYCLOVIR HCL 1 G PO TABS
1000.0000 mg | ORAL_TABLET | Freq: Three times a day (TID) | ORAL | 0 refills | Status: AC
Start: 2022-10-31 — End: ?

## 2022-10-31 MED ORDER — ONDANSETRON HCL 4 MG/2ML IJ SOLN: 4.0000 mg | Freq: Once | INTRAMUSCULAR | Status: DC

## 2022-10-31 MED ORDER — VALACYCLOVIR HCL 500 MG PO TABS
1000.0000 mg | ORAL_TABLET | Freq: Once | ORAL | Status: AC
  Administered 2022-10-31: 1000 mg via ORAL
  Filled 2022-10-31: qty 2

## 2022-10-31 NOTE — ED Triage Notes (Signed)
Brought in by [facility] from home. Patient seen Tuesday by a doctor for sore to middle of eye, for " Spider Bite." Doctor prescribed Doxycycline, prednisone and some cream. This morning right eye swollen, with redness and sores/ Black spots.

## 2022-10-31 NOTE — ED Notes (Addendum)
R eye swollen shut, unable to assess visual acuity on R eye

## 2022-10-31 NOTE — ED Provider Notes (Signed)
[place] EMERGENCY DEPARTMENT AT [facility] Provider Note   CSN: [id number] Arrival date & time: 10/31/22  0915     History  No chief complaint on file.   Noah Jennings is a 87 y.o. male.  To the ER today for rash to his face that started 3 days ago as a couple spots on the right side of his forehead, to start receiving doxycycline and a topical ointment, was not to be a "spider bite".  He states that over the past 2 days has progressed and he is having swelling of his right eye, denies vision changes, also reports some pain behind the eye.  Is a several weeks ago he had walked into a door and hit his head, had some headaches but these had resolved a couple days before today symptoms started.  Has fever chills, no nausea or vomiting, no drainage from the wound.  HPI     Home Medications Prior to Admission medications   Medication Sig Start Date End Date Taking? Authorizing Provider  valACYclovir (VALTREX) 1000 MG tablet Take 1 tablet (1,000 mg total) by mouth 3 (three) times daily. 10/31/22  Yes Dakhari Zuver A, PA-C  aspirin EC 81 MG tablet Take 81 mg by mouth every evening.    [provider]  Cholecalciferol (VITAMIN D3) 125 MCG (5000 UT) CAPS Take 1 capsule by mouth every evening.    [provider]  losartan (COZAAR) 50 MG tablet Take 100 mg by mouth daily.  08/25/18   [provider]  rosuvastatin (CRESTOR) 10 MG tablet Take 10 mg by mouth every evening.     [provider]      Allergies    Patient has no known allergies.    Review of Systems   Review of Systems  Physical Exam Updated Vital Signs BP (!) 145/68 (BP Location: Left Arm)   Temp 99 F (37.2 C) (Oral)   Resp 17   Ht 5\' 5"  (1.651 m)   Wt 70.3 kg   SpO2 95%   BMI 25.79 kg/m  Physical Exam Vitals and nursing note reviewed.  Constitutional:      General: He is not in acute distress.    Appearance: He is well-developed.  HENT:     Head: Normocephalic  and atraumatic.     Mouth/Throat:     Mouth: Mucous membranes are moist.  Eyes:     Intraocular pressure: Right eye pressure is 28 mmHg. Left eye pressure is 13 mmHg. Measurements were taken using a handheld tonometer.    Extraocular Movements: Extraocular movements intact.     Right eye: Normal extraocular motion and no nystagmus.     Left eye: Normal extraocular motion and no nystagmus.     Conjunctiva/sclera:     Right eye: Right conjunctiva is injected. No chemosis or exudate.    Pupils: Pupils are equal, round, and reactive to light.     Right eye: Fluorescein uptake present.     Comments: Lab examination with fluorescein staining shows take at approximately 12:00 appears to be dendritic lesion  Cardiovascular:     Rate and Rhythm: Normal rate and regular rhythm.     Heart sounds: No murmur heard. Pulmonary:     Effort: Pulmonary effort is normal. No respiratory distress.     Breath sounds: Normal breath sounds.  Abdominal:     Palpations: Abdomen is soft.     Tenderness: There is no abdominal tenderness.  Musculoskeletal:  General: No swelling.     Cervical back: Neck supple.  Skin:    General: Skin is warm and dry.     Capillary Refill: Capillary refill takes less than 2 seconds.  Neurological:     General: No focal deficit present.     Mental Status: He is alert and oriented to person, place, and time.  Psychiatric:        Mood and Affect: Mood normal.     ED Results / Procedures / Treatments   Labs (all labs ordered are listed, but only abnormal results are displayed) Labs Reviewed - No data to display  EKG None  Radiology No results found.  Procedures Procedures    Medications Ordered in ED Medications  fluorescein ophthalmic strip 1 strip (1 strip Right Eye Given 10/31/22 1003)  tetracaine (PONTOCAINE) 0.5 % ophthalmic solution 1 drop (1 drop Right Eye Given 10/31/22 1003)  ondansetron (ZOFRAN-ODT) disintegrating tablet 4 mg (4 mg Oral Given 10/31/22  1002)  valACYclovir (VALTREX) tablet 1,000 mg (1,000 mg Oral Given 10/31/22 1042)    ED Course/ Medical Decision Making/ A&P                                 Medical Decision Making This patient presents to the ED for concern of rash to right side of face, swelling of right eye, this involves an extensive number of treatment options, and is a complaint that carries with it a high risk of complications and morbidity.  The differential diagnosis includes synovitis, periorbital cellulitis, herpes zoster, contact dermatitis, other    Additional history obtained:  Additional history obtained from EMR External records from outside source obtained and reviewed including notes     Consultations Obtained:  I requested consultation with the on-call ophthalmologist Dr. Marchelle Gearing,  and discussed lab and imaging findings as well as pertinent plan - they recommend: Patient seen in the office upon discharge   Problem List / ED Course / Critical interventions / Medication management  Herpes zoster ophthalmicus-patient had 3 days of symptoms, diagnosed with cellulitis by PCP on exam he is got some mild ptosis of the right eyelid, decreased vision in the right eye at 2200, slightly increased intraocular pressure on the right and does have an epithelial dendritic lesion might fluorescein stain with Woods lamp.  He was given his first dose of Valtrex, I consulted with ophthalmology as above and they would like him to be seen in the office today if possible.  His son and daughter-in-law are at bedside and are able to ensure he gets directly to the office for further evaluation. I ordered medication including Zofran for nausea Reevaluation of the patient after these medicines showed that the patient improved I have reviewed the patients home medicines and have made adjustments as needed      Risk Prescription drug management.           Final Clinical Impression(s) / ED Diagnoses Final  diagnoses:  Herpes zoster ophthalmicus of right eye    Rx / DC Orders ED Discharge Orders          Ordered    valACYclovir (VALTREX) 1000 MG tablet  3 times daily        10/31/22 [address] A, PA-C 10/31/22 1052    Loetta Rough, MD 10/31/22 1128

## 2022-10-31 NOTE — ED Notes (Signed)
Assisted pt with R eye to complete visual acuity screening. R eye 20/200 and L eye is 20/30--PA-C made aware

## 2022-10-31 NOTE — Discharge Instructions (Addendum)
The pleasure taking care of you today.  You have shingles affecting your right eye.  I spoke with the eye doctor, Dr. Marchelle Gearing.  He would like you to go directly to the office in [place] upon discharge.

## 2022-10-31 NOTE — ED Notes (Signed)
Informed pt to go straight to eye doctor listed on discharge papers as they close at 1 pm. Pt verbalized understanding

## 2023-04-06 DIAGNOSIS — J019 Acute sinusitis, unspecified: Secondary | ICD-10-CM | POA: Diagnosis not present

## 2023-04-06 DIAGNOSIS — R059 Cough, unspecified: Secondary | ICD-10-CM | POA: Diagnosis not present

## 2023-05-19 DIAGNOSIS — E559 Vitamin D deficiency, unspecified: Secondary | ICD-10-CM | POA: Diagnosis not present

## 2023-05-19 DIAGNOSIS — E039 Hypothyroidism, unspecified: Secondary | ICD-10-CM | POA: Diagnosis not present

## 2023-05-19 DIAGNOSIS — R7301 Impaired fasting glucose: Secondary | ICD-10-CM | POA: Diagnosis not present

## 2023-05-19 DIAGNOSIS — I10 Essential (primary) hypertension: Secondary | ICD-10-CM | POA: Diagnosis not present

## 2023-05-25 DIAGNOSIS — Z79899 Other long term (current) drug therapy: Secondary | ICD-10-CM | POA: Diagnosis not present

## 2023-05-25 DIAGNOSIS — E782 Mixed hyperlipidemia: Secondary | ICD-10-CM | POA: Diagnosis not present

## 2023-05-25 DIAGNOSIS — E039 Hypothyroidism, unspecified: Secondary | ICD-10-CM | POA: Diagnosis not present

## 2023-05-25 DIAGNOSIS — E559 Vitamin D deficiency, unspecified: Secondary | ICD-10-CM | POA: Diagnosis not present

## 2023-05-25 DIAGNOSIS — R7301 Impaired fasting glucose: Secondary | ICD-10-CM | POA: Diagnosis not present

## 2023-05-25 DIAGNOSIS — R350 Frequency of micturition: Secondary | ICD-10-CM | POA: Diagnosis not present

## 2023-05-25 DIAGNOSIS — Z713 Dietary counseling and surveillance: Secondary | ICD-10-CM | POA: Diagnosis not present

## 2023-05-25 DIAGNOSIS — K5900 Constipation, unspecified: Secondary | ICD-10-CM | POA: Diagnosis not present

## 2023-05-25 DIAGNOSIS — Z6828 Body mass index (BMI) 28.0-28.9, adult: Secondary | ICD-10-CM | POA: Diagnosis not present

## 2023-05-25 DIAGNOSIS — I10 Essential (primary) hypertension: Secondary | ICD-10-CM | POA: Diagnosis not present

## 2023-05-25 DIAGNOSIS — R351 Nocturia: Secondary | ICD-10-CM | POA: Diagnosis not present

## 2023-05-25 DIAGNOSIS — N401 Enlarged prostate with lower urinary tract symptoms: Secondary | ICD-10-CM | POA: Diagnosis not present

## 2023-10-29 DIAGNOSIS — H02883 Meibomian gland dysfunction of right eye, unspecified eyelid: Secondary | ICD-10-CM | POA: Diagnosis not present

## 2023-10-29 DIAGNOSIS — H26492 Other secondary cataract, left eye: Secondary | ICD-10-CM | POA: Diagnosis not present

## 2023-10-29 DIAGNOSIS — Z961 Presence of intraocular lens: Secondary | ICD-10-CM | POA: Diagnosis not present

## 2023-10-29 DIAGNOSIS — H40051 Ocular hypertension, right eye: Secondary | ICD-10-CM | POA: Diagnosis not present

## 2023-10-29 DIAGNOSIS — H02886 Meibomian gland dysfunction of left eye, unspecified eyelid: Secondary | ICD-10-CM | POA: Diagnosis not present

## 2023-11-19 DIAGNOSIS — R7301 Impaired fasting glucose: Secondary | ICD-10-CM | POA: Diagnosis not present

## 2023-11-19 DIAGNOSIS — I10 Essential (primary) hypertension: Secondary | ICD-10-CM | POA: Diagnosis not present

## 2023-11-19 DIAGNOSIS — E559 Vitamin D deficiency, unspecified: Secondary | ICD-10-CM | POA: Diagnosis not present

## 2023-11-19 DIAGNOSIS — E039 Hypothyroidism, unspecified: Secondary | ICD-10-CM | POA: Diagnosis not present

## 2023-11-25 DIAGNOSIS — K5900 Constipation, unspecified: Secondary | ICD-10-CM | POA: Diagnosis not present

## 2023-11-25 DIAGNOSIS — E039 Hypothyroidism, unspecified: Secondary | ICD-10-CM | POA: Diagnosis not present

## 2023-11-25 DIAGNOSIS — N401 Enlarged prostate with lower urinary tract symptoms: Secondary | ICD-10-CM | POA: Diagnosis not present

## 2023-11-25 DIAGNOSIS — Z713 Dietary counseling and surveillance: Secondary | ICD-10-CM | POA: Diagnosis not present

## 2023-11-25 DIAGNOSIS — Z6828 Body mass index (BMI) 28.0-28.9, adult: Secondary | ICD-10-CM | POA: Diagnosis not present

## 2023-11-25 DIAGNOSIS — E559 Vitamin D deficiency, unspecified: Secondary | ICD-10-CM | POA: Diagnosis not present

## 2023-11-25 DIAGNOSIS — E782 Mixed hyperlipidemia: Secondary | ICD-10-CM | POA: Diagnosis not present

## 2023-11-25 DIAGNOSIS — I10 Essential (primary) hypertension: Secondary | ICD-10-CM | POA: Diagnosis not present

## 2023-11-25 DIAGNOSIS — Z23 Encounter for immunization: Secondary | ICD-10-CM | POA: Diagnosis not present

## 2023-11-25 DIAGNOSIS — R7301 Impaired fasting glucose: Secondary | ICD-10-CM | POA: Diagnosis not present
